# Patient Record
Sex: Male | Born: 1960 | Race: White | Hispanic: No | Marital: Married | Smoking: Never smoker
Health system: Southern US, Community
[De-identification: ages and names within clinical notes are randomized; demographics above are authoritative.]

## PROBLEM LIST (undated history)

## (undated) DIAGNOSIS — C170 Malignant neoplasm of duodenum: Secondary | ICD-10-CM

## (undated) DIAGNOSIS — Z9889 Other specified postprocedural states: Secondary | ICD-10-CM

## (undated) DIAGNOSIS — R112 Nausea with vomiting, unspecified: Secondary | ICD-10-CM

## (undated) DIAGNOSIS — T8859XA Other complications of anesthesia, initial encounter: Secondary | ICD-10-CM

## (undated) DIAGNOSIS — I480 Paroxysmal atrial fibrillation: Secondary | ICD-10-CM

## (undated) DIAGNOSIS — T4145XA Adverse effect of unspecified anesthetic, initial encounter: Secondary | ICD-10-CM

## (undated) DIAGNOSIS — Q057 Lumbar spina bifida without hydrocephalus: Secondary | ICD-10-CM

## (undated) HISTORY — DX: Lumbar spina bifida without hydrocephalus: Q05.7

## (undated) HISTORY — PX: SPINAL FIXATION SURGERY: SHX1055

## (undated) HISTORY — DX: Paroxysmal atrial fibrillation: I48.0

## (undated) HISTORY — PX: FOOT SURGERY: SHX648

## (undated) HISTORY — PX: VENTRAL HERNIA REPAIR: SHX424

## (undated) HISTORY — PX: PROSTATE BIOPSY: SHX241

## (undated) HISTORY — DX: Malignant neoplasm of duodenum: C17.0

## (undated) HISTORY — PX: EXPLORATORY LAPAROTOMY W/ BOWEL RESECTION: SHX1544

## (undated) NOTE — Telephone Encounter (Signed)
 Formatting of this note might be different from the original. done Electronically signed by Rosi Lauraine PEDLAR, MD at 07/05/2024  1:52 PM CDT

## (undated) NOTE — Telephone Encounter (Signed)
 Formatting of this note might be different from the original. Patient calls and LVM  Requesting a referral to [facility] for Prostate Cancer Order pended for provider review and completion   Electronically signed by Bedelia Milling, RN at 07/05/2024  1:35 PM CDT

---

## 2004-10-02 ENCOUNTER — Ambulatory Visit (HOSPITAL_BASED_OUTPATIENT_CLINIC_OR_DEPARTMENT_OTHER): Admission: RE | Admit: 2004-10-02 | Discharge: 2004-10-02 | Payer: Self-pay | Admitting: Orthopedic Surgery

## 2004-10-02 ENCOUNTER — Ambulatory Visit (HOSPITAL_COMMUNITY): Admission: RE | Admit: 2004-10-02 | Discharge: 2004-10-02 | Payer: Self-pay | Admitting: Orthopedic Surgery

## 2006-07-30 ENCOUNTER — Ambulatory Visit (HOSPITAL_BASED_OUTPATIENT_CLINIC_OR_DEPARTMENT_OTHER): Admission: RE | Admit: 2006-07-30 | Discharge: 2006-07-30 | Payer: Self-pay | Admitting: Orthopedic Surgery

## 2007-12-18 ENCOUNTER — Encounter: Admission: RE | Admit: 2007-12-18 | Discharge: 2007-12-18 | Payer: Self-pay | Admitting: Internal Medicine

## 2011-03-08 NOTE — Op Note (Signed)
Dennis Chase, Dennis Chase               ACCOUNT NO.:  [id number]   MEDICAL RECORD NO.:  [id number]          PATIENT TYPE:  AMB   LOCATION:  DSC                          FACILITY:  [facility]   PHYSICIAN:  Leonides Grills, M.D.     DATE OF BIRTH:  1961/06/18   DATE OF PROCEDURE:  07/30/2006  DATE OF DISCHARGE:                                 OPERATIVE REPORT   PREOPERATIVE DIAGNOSIS:  Left base of fifth metatarsal plantar spur.   POSTOPERATIVE DIAGNOSIS:  Left base of fifth metatarsal plantar spur.   OPERATION:  Partial excision, left base of fifth metatarsal.   SURGEON:  Leonides Grills, M.D.   ASSISTANT:  Evlyn Kanner, P.A.-C.   ANESTHESIA:  General.   ESTIMATED BLOOD LOSS:  Minimal.   TOURNIQUET TIME:  Approximately 20 minutes.   COMPLICATIONS:  None.   DISPOSITION:  Stable to PAR.   INDICATIONS:  This is a 50 year old male, who has had a triple arthrodesis  and multiple other surgical procedures, and has had a prominence at the base  of the fifth metatarsal with a possible impending ulcer.  He has  syringomyelia.  He was consented for the above procedure, and all risks,  which included infection, nerve and vessel injury, wound breakdown, possible  persistent ulcer and pain, possibility of pathologic fracture through this  area, were all explained.  Questions were encouraged and answered.   OPERATIVE PROCEDURE:  The patient was brought to the operating room and  placed in supine position.  After adequate general endotracheal tube  anesthesia was administered, as well as Ancef 1 g IV piggyback, a bump was  placed under the ipsilateral hip, internally rotating the left lower  extremity, and the left lower extremity was then prepped and draped in a  sterile manner.  Over a proximally placed thigh tourniquet, the limb was  gravity exsanguinated and the tourniquet was elevated to 290 mmHg.  A  longitudinal incision was then made at the base of the fifth metatarsal  laterally.   Dissection was carried down directly to bone.  Soft tissue was  elevated off the plantar aspect, lateral aspect of the base of the fifth  metatarsal, and with a 3/4-inch straight osteotome, the base of the left  fifth metatarsal was then removed.  An x-ray was obtained, lateral view, and  showed this was adequately decompressed and there was an even plane of bone  over the lateral column of the foot.  The area was copiously irrigated with  normal saline.  Bone wax was applied to exposed bone surfaces.  The wound  was copiously irrigated  one more time with normal saline.  The tourniquet was deflated.  Hemostasis  was obtained.  The subcu was closed with 3-0 Vicryl.  The skin was closed  with 4-0 nylon.  A sterile dressing was applied.  A hard-sole shoe was  applied.  The patient was stable to the PAR.      Leonides Grills, M.D.  Electronically Signed     PB/MEDQ  D:  07/30/2006  T:  07/30/2006  Job:  [id number]

## 2011-03-08 NOTE — Op Note (Signed)
NAMEANTHEM, FRAZER               ACCOUNT NO.:  [id number]   MEDICAL RECORD NO.:  [id number]          PATIENT TYPE:  AMB   LOCATION:  DSC                          FACILITY:  [facility]   PHYSICIAN:  Leonides Grills, M.D.     DATE OF BIRTH:  25-Mar-1961   DATE OF PROCEDURE:  10/02/2004  DATE OF DISCHARGE:                                 OPERATIVE REPORT   PREOPERATIVE DIAGNOSIS:  1.  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  2.  Left foot drop.  3.  Benign deep soft tissue lesion, foot.   POSTOPERATIVE DIAGNOSIS:  1.  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  2.  Left foot drop.  3.  Benign deep soft tissue lesion, foot.   OPERATION:  1.  Left lateralizing calcaneal osteotomy.  2.  Stress x-rays left foot.  3.  Posterior tibial tendon to dorsolateral mid foot tendon transfer.  4.  Excision benign deep soft tissue lesion foot.   ANESTHESIA:  General endotracheal anesthesia with popliteal block.   SURGEON:  Leonides Grills, M.D.   ASSISTANT:  Lianne Cure, P.A.-C.   ESTIMATED BLOOD LOSS:  Minimal.   TOURNIQUET TIME:  Approximately 2 hours.   COMPLICATIONS:  None.   DISPOSITION:  Stable to the PR.   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  He had a previous mid foot as  well as triple arthrodesis.  He has had pain that is interfering with his  life to the point he could not do what he wants to do.  He was consented for  the above procedure.  All risks which include infection, neurovascular  injury, posterior tibial tendon rupture, persistent pain, worsening pain,  possible future surgeries, were all explained, questions were encouraged and  answered.   OPERATION:  The patient was brought to the operating room and placed in  supine position.  After adequate general endotracheal anesthesia was  administered as well as Ancef 1 gram IV piggyback as well as popliteal  block, the left lower  extremity was then prepped and draped in a sterile  manner over a proximally placed thigh tourniquet.  The limb was gravity  exsanguinated and the tourniquet was elevated to 290 mmHg.  A longitudinal  incision over the posterior tibial tendon was then made.  Dissection was  carried down through the skin and hemostasis was obtained.  The posterior  tibial tendon was identified and was dissected out as distal as possible and  tenotomy was then performed.  Approximately 80 mm proximal to this on the  posterior medial border of the tibia, an incision was made, the saphenous  nerve and vein were identified and retracted out of harms way.  A fasciotomy  was then performed.  The EHL was identified and retracted out of harms way.  The posterior tibial tendon was then identified and by pulling distally, the  tendon was verified.  This was then pulled out of the wound proximally and  placed in a moist lap.  We then made a  longitudinal incision between the  lateral anterior tibial crest as well as the medial aspect of the anterior  tibialis tendon.  Dissection was carried down through skin staying onto  bone.  With a Kelly clamp, the interosseous membrane between the tibia and  fibula was then split and with a tendon suture passer after #2 FiberWire was  placed in a Krakow stitch in the distal aspect of the posterior tibial  tendon, this was then pulled from the posterior aspect of the tibia around  the lateral aspect of the tibia through this anterior wound.  We then  tunneled subcutaneously anteriorly to the ankle to a point on the mid foot  area.  Under C-arm guidance, this area was then verified, as well.  Using  the C-arm, we dorsiflexed the ankle and we found that there was already  anterior ankle impingement and there was no need to do a percutaneous tendo-  Achilles lengthening for the amount of dorsiflexion that would be obtained  was minimal.  We  elected not to perform a dorsiflexion first  metatarsal  osteotomy due to the fact that the forefoot was relatively balanced and due  to the hindfoot malalignment, we felt that just with a lateralizing  calcaneal osteotomy, this would be helpful.  We then made a drill hole under  C-arm guidance between the middle and lateral cuneiforms just medial to one  of the staples in the bone.  This was drilled with a 7 mm drill.  A 7 by 23  mm [employer] tenodesis screw was then chosen and using the standard  technique, posterior tibial tendon was placed in this hole and this had an  excellent repair.  We then sewed the remaining posterior tibial tendon to  the remaining periosteum locally, as well.  This was done with the ankle in  maximal dorsiflexion.  There was an excellent repair and we were able to  range the ankle and the tendon was stable.  We then made a longitudinal  incision over the lateral aspect of the calcaneal tuber, dissection was  carried down to bone, soft tissues were elevated on both superior and  inferior aspects of the bone and with a sagittal saw, this was then cut.  The soft tissue was stretched with a laminar spreader and an osteotome and  the heel was translated approximately 5-6 mm laterally.  This was  provisionally fixed with a K-wire.  A longitudinal incision was then made on  the claval skin border.  Dissection was carried down to bone.  With a 4.5,  3.2 mm drill holes respectively were made and a 60 mm long 16 mm threaded  6.5 mm partially threaded cancellous screw was placed, this had excellent  compression across the osteotomy site.  Stress x-rays were obtained in the  lateral and axial views and showed that the hardware was in proper position,  there was no gross motion at the osteotomy site.  A longitudinal incision  was then made over the adventitial soft tissue lesion at the base of the  fifth metatarsal.  Dissection was carried down through the skin, this was completely dissected out in total, as well.   This was benign adventitial  type tissue from repeated wear over this area and there was no need to send  this to pathology.  The tourniquet was deflated, hemostasis was obtained.  The deep tissues were closed with 3-0 Vicryl, the skin was closed with 4-0  nylon over all wounds.  A sterile  dressing was applied, a modified Jones  dressing was applied with the ankle in neutral dorsiflexion.  The patient  was stable to the PR.      Paul   PB/MEDQ  D:  10/02/2004  T:  10/02/2004  Job:  [id number]

## 2011-06-06 ENCOUNTER — Other Ambulatory Visit: Payer: Self-pay | Admitting: Gastroenterology

## 2011-06-06 ENCOUNTER — Ambulatory Visit (HOSPITAL_COMMUNITY)
Admission: RE | Admit: 2011-06-06 | Discharge: 2011-06-06 | Disposition: A | Discharge status: Home / Self Care | Payer: BC Managed Care – PPO | Source: Ambulatory Visit | Attending: Gastroenterology | Admitting: Gastroenterology

## 2011-06-06 DIAGNOSIS — K449 Diaphragmatic hernia without obstruction or gangrene: Secondary | ICD-10-CM | POA: Insufficient documentation

## 2011-06-06 DIAGNOSIS — K219 Gastro-esophageal reflux disease without esophagitis: Secondary | ICD-10-CM | POA: Insufficient documentation

## 2011-06-06 DIAGNOSIS — D1330 Benign neoplasm of unspecified part of small intestine: Secondary | ICD-10-CM | POA: Insufficient documentation

## 2011-06-06 DIAGNOSIS — Z860100 Personal history of colon polyps, unspecified: Secondary | ICD-10-CM | POA: Insufficient documentation

## 2011-06-13 NOTE — Op Note (Signed)
  NAMETRAVARIS, Dennis NO.:  [id number]  MEDICAL RECORD NO.:  [id number]  LOCATION:  WLEN                         FACILITY:  [facility]  PHYSICIAN:  Danise Edge, M.D.   DATE OF BIRTH:  [birth year]  DATE OF PROCEDURE:  06/06/2011 DATE OF DISCHARGE:                              OPERATIVE REPORT   REFERRING PHYSICIAN:  Leone Haven, MD, Department of Surgery, [facility].  HISTORY:  Mr. Mariusz Jubb is a 50 year old male born on 1961-03-20. In 2009, the patient was diagnosed with a tubulovillous adenoma involving the duodenum.  The patient was referred to Dr. Leone Haven at [facility], who performed a modified-Whipple procedure to remove the large tubulovillous adenoma of the duodenum. The patient was scheduled to undergo a diagnostic esophagogastroduodenoscopy to ensure no recurrence of the neoplastic tissue in the small intestine.  ENDOSCOPIST:  Danise Edge, MD  PREMEDICATION: 1. Fentanyl 75 mcg. 2. Versed 7.5 mg.  PROCEDURE IN DETAIL:  The patient was placed in the left lateral decubitus position.  The Pentax gastroscope was passed through the posterior hypopharynx into the proximal esophagus without difficulty. The hypopharynx, larynx, and vocal cords appeared normal.  Gastroscopy:  There is a small hiatal hernia.  Retroflexed view of the gastric cardia and fundus was normal.  The gastric body, antrum and pylorus appear normal.  Enteroscopy:  The proximal small intestine appears normal.  The surgical anastomosis was identified and appeared normal.  Biopsies were taken from the surgical anastomosis.  Both the efferent and afferent limbs of the small intestine appeared normal.  I do not see any obvious residual neoplastic tissue in the small intestine.  ASSESSMENT:  Normal esophagogastroduodenoscopy post modified-Whipple procedure to remove a tubulovillous adenoma of the duodenum.  Biopsies were performed  at the surgical anastomosis.  No obvious neoplastic tissue was identified.          ______________________________ Danise Edge, M.D.     MJ/MEDQ  D:  06/06/2011  T:  06/06/2011  Job:  [id number]  cc:   Leone Haven, MD Fax: [phone]  Electronically Signed by Danise Edge M.D. on 06/13/2011 04:48:09 PM

## 2015-01-23 ENCOUNTER — Other Ambulatory Visit: Payer: Self-pay | Admitting: Internal Medicine

## 2015-01-23 DIAGNOSIS — N509 Disorder of male genital organs, unspecified: Secondary | ICD-10-CM

## 2015-01-25 ENCOUNTER — Ambulatory Visit
Admission: RE | Admit: 2015-01-25 | Discharge: 2015-01-25 | Disposition: A | Discharge status: Home / Self Care | Payer: BC Managed Care – PPO | Source: Ambulatory Visit | Attending: Internal Medicine | Admitting: Internal Medicine

## 2015-01-25 DIAGNOSIS — N509 Disorder of male genital organs, unspecified: Secondary | ICD-10-CM

## 2015-02-10 ENCOUNTER — Other Ambulatory Visit: Payer: Self-pay | Admitting: Internal Medicine

## 2015-02-10 ENCOUNTER — Ambulatory Visit
Admission: RE | Admit: 2015-02-10 | Discharge: 2015-02-10 | Disposition: A | Discharge status: Home / Self Care | Payer: BC Managed Care – PPO | Source: Ambulatory Visit | Attending: Internal Medicine | Admitting: Internal Medicine

## 2015-02-10 DIAGNOSIS — R0602 Shortness of breath: Secondary | ICD-10-CM

## 2015-02-13 ENCOUNTER — Other Ambulatory Visit: Payer: Self-pay | Admitting: Cardiology

## 2015-02-13 ENCOUNTER — Encounter: Payer: Self-pay | Admitting: Cardiology

## 2015-02-13 DIAGNOSIS — R0600 Dyspnea, unspecified: Secondary | ICD-10-CM

## 2015-02-13 NOTE — Progress Notes (Signed)
Patient ID: Dennis Chase, male   DOB: 1961-03-18, 55 y.o.   MRN: [id number]    Larnce, Schnackenberg    Date of visit:  02/13/2015 DOB:  18-Jan-1961    Age:  12 yrs. Medical record number:  [id number]     Account number:  [id number] Primary Care Provider: SHAMLEFFER,IBTEHAL ____________________________ CURRENT DIAGNOSES  1. Chest pain  2. Dyspnea  3. Palpitations  ____________________________ ALLERGIES  No Known Allergies ____________________________ MEDICATIONS  1. zolpidem 10 mg tablet, 1/2 hs prn  2. ibuprofen 200 mg tablet, PRN ____________________________ CHIEF COMPLAINTS  Anxious feeling chest  Dyspnea  Palp ____________________________ HISTORY OF PRESENT ILLNESS This very nice 54 year old male is seen urgently for evaluation of 3 episodes of some exertional type symptoms. The patient has had a prior history of spinal bifida with repair as an infant and then several corrective surgeries on his lower leg previously. 2 years ago he had resection of a tumor of his duodenum at [facility] and reports having had an echo at that time. He had any recent urinary tract infection and was seen by urology and treated with Cipro about 3 weeks ago. Within the past 2 weeks he has experienced one episode where he was carrying his saxophone a strap around his neck and had the onset of upper neck tightness as well as some shortness of breath. He normally is active and rides his bicycle to work everyday. He had another episode where he was turning his head to one side and developed fairly sudden onset of sharp pain in his neck and shoulders associated with some shortness of breath and then resolved. He was getting on his bicycle to come home on Friday and developed discomfort across his upper shoulders and neck associated with some shortness of breath. He had difficulty completing his bike ride home and complained of some irregularity in his pulse. He was seen at his primary doctor's office that afternoon and was out was  asked to see him urgently today. He has felt well over the weekend. At no time did he have any anterior pressure-type chest pain. He has never had syncope. There's no premature family history of cardiovascular disease and his lipid status is previously been completely normal. He has no prior history of hypertension. His primary doctor was quite worried about possible crescendo cardiac symptoms.  The patient had resection of a duodenal adenoma at [facility] several years ago. 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 In 2013 he had a brief episode of atrial fibrillation occurring after surgery that responded to metoprolol. An echocardiogram done at [facility] at that time showed a possible bicuspid aortic valve with fusion of the raphae but normal left ventricular function. ____________________________ PAST HISTORY  Past Medical Illnesses:  spinal bifida;  Cardiovascular Illnesses:  no previous history of cardiac disease.;  Surgical Procedures:  spinal bifida surg, feet surg, tumor duodenum, ventral hernia surgery x 3;  NYHA Classification:  I;  Canadian Angina Classification:  Class 0: Asymptomatic;  Cardiology Procedures-Invasive:  no history of prior cardiac procedures;  Cardiology Procedures-Noninvasive:  echocardiogram;  LVEF not documented,   ____________________________ CARDIO-PULMONARY TEST DATES EKG Date:  02/13/2015;   ____________________________ FAMILY HISTORY Brother -- Brother alive with problem, Atrial fibrillation Brother -- Brother alive with problem, Atrial fibrillation Brother -- Brother alive and well Father -- Father alive with problem, CVA, Pacemaker in situ Mother -- Mother dead, Pulmonary emphysema ____________________________ SOCIAL HISTORY Alcohol  Use:  occasionally;  Smoking:  never smoked;  Diet:  regular diet;  Lifestyle:  divorced and remarried;  Exercise:  cycling 5 days per week and  weight lifting;  Occupation:  [employer]  Residence:  lives with wife;   ____________________________ REVIEW OF SYSTEMS General:  denies recent weight change, fatique or change in exercise tolerance.  Integumentary :no rashes or new skin lesions. Eyes: denies diplopia, history of glaucoma or visual problems. Respiratory: intermittent dyspnea Cardiovascular:  please review HPI Abdominal: denies dyspepsia, GI bleeding, constipation, or diarrhea Genitourinary-Male: recent UTI and orchitis  Musculoskeletal:  denies arthritis, venous insufficiency, or muscle weakness Neurological:  denies headaches, stroke, or TIA Psychiatric:  denies depression or anxiety  ____________________________ PHYSICAL EXAMINATION VITAL SIGNS  Blood Pressure:  110/70 Sitting, Right arm, regular cuff  , 110/74 Standing, Right arm and regular cuff   Pulse:  60/min. Weight:  182.00 lbs. Height:  71"BMI: 25  Constitutional:  pleasant white male in no acute distress Skin:  warm and dry to touch, no apparent skin lesions, or masses noted. Head:  normocephalic, normal hair pattern, no masses or tenderness Eyes:  EOMS Intact, PERRLA, C and S clear, Funduscopic exam not done. ENT:  ears, nose and throat reveal no gross abnormalities.  Dentition good. Neck:  supple, without massess. No JVD, thyromegaly or carotid bruits. Carotid upstroke normal. Chest:  normal symmetry, clear to auscultation. Cardiac:  regular rhythm, normal S1 and S2, No S3 or S4, no murmurs, gallops or rubs detected. Abdomen:  well healed midline surgical scar, abdomen soft, no masses, no hepatosplenomegaly Peripheral Pulses:  the femoral,dorsalis pedis, and posterior tibial pulses are full and equal bilaterally with no bruits auscultated. Extremities & Back:  feet deformed with some surgical scars, some atrophy lower and upper legs Neurological:  abnormal gait due to prior spina bifida ____________________________ IMPRESSIONS/PLAN  1. Somewhat  atypical onset of neck and shoulder pain with abrupt onset associated with shortness of breath and vague irregularity of the pulse. His examination and EKG are normal. He is not able to do treadmill exercise due to gait abnormalities due to his spinal bifida. 2. Bicuspid aortic valve on ECHO at [facility] 2013 3. Brief post op a fib 2013  His 10 year cardiovascular risk profile is an estimated 2.2% risk of cardiac events which is very low risk over the next 10 years as well as an absolute lifetime risk of only 5.2%.  It is possible this could be some sort of cervical disc disease although this all occurred in the setting of recent treatment of a urinary tract infection. He is not currently having palpitations.  My recommendations would be for him to have a bicycle-type exercise test and we will arrange through the CPX lab at [facility]. If he continues to have palpitations then I would suggest he wear a cardiac event monitor. Try to obtain the echocardiogram that was done through [facility] and we may oder echo if he continues to have symptoms. I will await the results of the cardiopulmonary exercise test. Thank you for asking me to see him with you. ____________________________ TODAYS ORDERS  1. cardiopulmonary stress test  2. 12 Lead EKG: Today                       ____________________________ Cardiology Physician:  Kerry Hough MD [facility]

## 2015-02-22 ENCOUNTER — Ambulatory Visit (HOSPITAL_COMMUNITY): Payer: BC Managed Care – PPO

## 2015-02-22 ENCOUNTER — Encounter: Payer: Self-pay | Admitting: Cardiology

## 2015-02-22 ENCOUNTER — Inpatient Hospital Stay (HOSPITAL_COMMUNITY)
Admission: AD | Admit: 2015-02-22 | Discharge: 2015-02-23 | DRG: 310 | Disposition: A | Discharge status: Home / Self Care | Payer: BC Managed Care – PPO | Source: Ambulatory Visit | Attending: Cardiology | Admitting: Cardiology

## 2015-02-22 ENCOUNTER — Encounter (HOSPITAL_COMMUNITY): Payer: Self-pay | Admitting: General Practice

## 2015-02-22 ENCOUNTER — Other Ambulatory Visit: Payer: Self-pay | Admitting: Cardiology

## 2015-02-22 ENCOUNTER — Inpatient Hospital Stay (HOSPITAL_BASED_OUTPATIENT_CLINIC_OR_DEPARTMENT_OTHER): Payer: BC Managed Care – PPO

## 2015-02-22 ENCOUNTER — Inpatient Hospital Stay (HOSPITAL_COMMUNITY): Payer: BC Managed Care – PPO

## 2015-02-22 DIAGNOSIS — I4891 Unspecified atrial fibrillation: Secondary | ICD-10-CM | POA: Insufficient documentation

## 2015-02-22 DIAGNOSIS — I4819 Other persistent atrial fibrillation: Secondary | ICD-10-CM | POA: Diagnosis present

## 2015-02-22 DIAGNOSIS — R0602 Shortness of breath: Secondary | ICD-10-CM | POA: Diagnosis present

## 2015-02-22 DIAGNOSIS — I712 Thoracic aortic aneurysm, without rupture: Secondary | ICD-10-CM

## 2015-02-22 DIAGNOSIS — I7121 Aneurysm of the ascending aorta, without rupture: Secondary | ICD-10-CM

## 2015-02-22 DIAGNOSIS — Z85068 Personal history of other malignant neoplasm of small intestine: Secondary | ICD-10-CM

## 2015-02-22 DIAGNOSIS — I77819 Aortic ectasia, unspecified site: Secondary | ICD-10-CM | POA: Diagnosis present

## 2015-02-22 DIAGNOSIS — R0600 Dyspnea, unspecified: Secondary | ICD-10-CM | POA: Diagnosis not present

## 2015-02-22 DIAGNOSIS — I480 Paroxysmal atrial fibrillation: Secondary | ICD-10-CM | POA: Diagnosis present

## 2015-02-22 DIAGNOSIS — Z87440 Personal history of urinary (tract) infections: Secondary | ICD-10-CM | POA: Diagnosis not present

## 2015-02-22 HISTORY — DX: Other specified postprocedural states: Z98.890

## 2015-02-22 HISTORY — DX: Adverse effect of unspecified anesthetic, initial encounter: T41.45XA

## 2015-02-22 HISTORY — DX: Other complications of anesthesia, initial encounter: T88.59XA

## 2015-02-22 HISTORY — DX: Nausea with vomiting, unspecified: R11.2

## 2015-02-22 LAB — COMPREHENSIVE METABOLIC PANEL
ALBUMIN: 4 g/dL (ref 3.5–5.0)
ALT: 38 U/L (ref 17–63)
AST: 35 U/L (ref 15–41)
Alkaline Phosphatase: 52 U/L (ref 38–126)
Anion gap: 11 (ref 5–15)
BUN: 14 mg/dL (ref 6–20)
CHLORIDE: 105 mmol/L (ref 101–111)
CO2: 23 mmol/L (ref 22–32)
CREATININE: 0.98 mg/dL (ref 0.61–1.24)
Calcium: 9.3 mg/dL (ref 8.9–10.3)
GFR calc Af Amer: >60 mL/min (ref >60)
GFR calc non Af Amer: >60 mL/min (ref >60)
Glucose, Bld: 84 mg/dL (ref 70–99)
Potassium: 4.4 mmol/L (ref 3.5–5.1)
Sodium: 139 mmol/L (ref 135–145)
TOTAL PROTEIN: 6.5 g/dL (ref 6.5–8.1)
Total Bilirubin: 1 mg/dL (ref 0.3–1.2)

## 2015-02-22 LAB — CBC WITH DIFFERENTIAL/PLATELET
BASOS ABS: 0.1 10*3/uL (ref 0.0–0.1)
Basophils Relative: 1 % (ref 0–1)
EOS PCT: 2 % (ref 0–5)
Eosinophils Absolute: 0.1 10*3/uL (ref 0.0–0.7)
HCT: 43.1 % (ref 39.0–52.0)
Hemoglobin: 15.1 g/dL (ref 13.0–17.0)
LYMPHS ABS: 1.4 10*3/uL (ref 0.7–4.0)
Lymphocytes Relative: 26 % (ref 12–46)
MCH: 30.4 pg (ref 26.0–34.0)
MCHC: 35 g/dL (ref 30.0–36.0)
MCV: 86.7 fL (ref 78.0–100.0)
Monocytes Absolute: 0.6 10*3/uL (ref 0.1–1.0)
Monocytes Relative: 10 % (ref 3–12)
NEUTROS PCT: 61 % (ref 43–77)
Neutro Abs: 3.5 10*3/uL (ref 1.7–7.7)
PLATELETS: 192 10*3/uL (ref 150–400)
RBC: 4.97 MIL/uL (ref 4.22–5.81)
RDW: 13.2 % (ref 11.5–15.5)
WBC: 5.6 10*3/uL (ref 4.0–10.5)

## 2015-02-22 LAB — TSH: TSH: 2.197 u[IU]/mL (ref 0.350–4.500)

## 2015-02-22 LAB — HEPARIN LEVEL (UNFRACTIONATED): Heparin Unfractionated: 0.21 IU/mL — ABNORMAL LOW (ref 0.30–0.70)

## 2015-02-22 LAB — MAGNESIUM: Magnesium: 1.9 mg/dL (ref 1.7–2.4)

## 2015-02-22 MED ORDER — HEPARIN (PORCINE) IN NACL 100-0.45 UNIT/ML-% IJ SOLN
1250.0000 [IU]/h | INTRAMUSCULAR | Status: DC
  Administered 2015-02-22: 1050 [IU]/h via INTRAVENOUS
  Administered 2015-02-23: 1250 [IU]/h via INTRAVENOUS
  Filled 2015-02-22 (×4): qty 250

## 2015-02-22 MED ORDER — HEPARIN BOLUS VIA INFUSION
1000.0000 [IU] | Freq: Once | INTRAVENOUS | Status: AC
  Administered 2015-02-22: 1000 [IU] via INTRAVENOUS
  Filled 2015-02-22: qty 1000

## 2015-02-22 MED ORDER — SODIUM CHLORIDE 0.9 % IV SOLN: 250.0000 mL | INTRAVENOUS | Status: DC | PRN

## 2015-02-22 MED ORDER — ONDANSETRON HCL 4 MG/2ML IJ SOLN: 4.0000 mg | Freq: Four times a day (QID) | INTRAMUSCULAR | Status: DC | PRN

## 2015-02-22 MED ORDER — FLECAINIDE ACETATE 50 MG PO TABS
50.0000 mg | ORAL_TABLET | Freq: Two times a day (BID) | ORAL | Status: DC
  Administered 2015-02-22 – 2015-02-23 (×3): 50 mg via ORAL
  Filled 2015-02-22 (×4): qty 1

## 2015-02-22 MED ORDER — HEPARIN (PORCINE) IN NACL 100-0.45 UNIT/ML-% IJ SOLN: 1050.0000 [IU]/h | INTRAMUSCULAR | Status: DC

## 2015-02-22 MED ORDER — ACETAMINOPHEN 325 MG PO TABS: 650.0000 mg | ORAL_TABLET | ORAL | Status: DC | PRN

## 2015-02-22 MED ORDER — HEPARIN BOLUS VIA INFUSION
4000.0000 [IU] | Freq: Once | INTRAVENOUS | Status: AC
  Administered 2015-02-22: 4000 [IU] via INTRAVENOUS
  Filled 2015-02-22: qty 4000

## 2015-02-22 MED ORDER — SODIUM CHLORIDE 0.9 % IJ SOLN
3.0000 mL | Freq: Two times a day (BID) | INTRAMUSCULAR | Status: DC
  Administered 2015-02-22 – 2015-02-23 (×2): 3 mL via INTRAVENOUS

## 2015-02-22 MED ORDER — SODIUM CHLORIDE 0.9 % IV SOLN
INTRAVENOUS | Status: DC
  Administered 2015-02-22: 22:00:00 via INTRAVENOUS

## 2015-02-22 MED ORDER — SODIUM CHLORIDE 0.9 % IJ SOLN: 3.0000 mL | INTRAMUSCULAR | Status: DC | PRN

## 2015-02-22 MED ORDER — METOPROLOL TARTRATE 25 MG PO TABS
25.0000 mg | ORAL_TABLET | Freq: Two times a day (BID) | ORAL | Status: DC
  Administered 2015-02-22 – 2015-02-23 (×3): 25 mg via ORAL
  Filled 2015-02-22 (×4): qty 1

## 2015-02-22 NOTE — H&P (Signed)
History and Physical   Admit date: 02/21/2015 Name:  Dennis Chase Medical record number: [id number] DOB/Age:  [birth year]  54 y.o. male  Primary Cardiologist:  Wynonia Lawman  Primary Physician:  Deforest Hoyles  Chief complaint/reason for admission: dyspnea and atrial fibrillation  HPI:  This very nice 54 year old male is admitted to the hospital for atrial fibrillation. The patient was initially seen by me on April 25 with 3 episodes of exertional type symptoms. He has a prior history of repair of spinal bifida as an infant and has has had several corrective surgeries on his lower legs and feet in the past. He had resection of a duodenal adenoma at [facility] several years ago. At that time there was a small focus of adenocarcinoma and he did well following the episodes may have had ventral hernia surgery. In 2013 he had a brief episode of atrial fibrillation occurring after surgery that responded to metoprolol. An echocardiogram done at [facility] at that time showed a possible bicuspid aortic valve with fusion of the raphae but normal LV function. He had a recent urinary tract infection was seen by urology and treated with Cipro about 3 weeks ago. 2 weeks prior to being seen he experienced one episode where he was carrying his saxophone on a strap around his neck and had the onset of upper neck tightness as well as shortness of breath. He normally is active and rides his bicycle to work everyday. He had another episode where he was turning his head to the side and developed sudden onset of sharp pain in his neck and shoulders associated with shortness of breath which then resolved. He was riding his bicycle home from work prior to being seen and developed discomfort across the upper shoulders and neck associated with shortness of breath and had difficulty completing his bike ride home and noted some mild irregularity in his pulse. He was seen at his primary care doctor's office that afternoon and was in sinus  rhythm and I was asked to see him urgently the next week. He felt well and at the time I was seeing him he was in sinus rhythm and felt fine. Because of his previous foot abnormality with spinal bifida he was not felt to be a good candidate for treadmill testing and arrangements were made for a CPX bicycle  test. A couple of days after having the visit with me he developed worsening shortness of breath with riding his bicycle that has persisted even at rest. He presented this morning for a CPX test and was found to be in atrial fibrillation. He is admitted to the hospital at this time for echocardiogram and TEE guided cardioversion since he remains symptomatic. His CHA2DS2VASC score is zero.   Past Medical History  Diagnosis Date  . Spina bifida of lumbar region   . Paroxysmal atrial fibrillation   . Adenocarcinoma of duodenum      Past Surgical History  Procedure Laterality Date  . Spinal fixation surgery    . Ventral hernia repair    . Foot surgery      multiple for spinal bifida  . Exploratory laparotomy w/ bowel resection      resection of duodenum   Allergies: has No Known Allergies.   Medications: Prior to Admission medications   Not on File    Family History:  Family Status  Relation Status Death Age  . Father Alive     CVA, pacer  . Mother Deceased     emphysema  .  Brother Alive     atrial fibrillation  . Brother Alive     atrial fibrillation  . Brother Alive     Social History:   reports that he has never smoked. He has never used smokeless tobacco. He reports that he drinks alcohol. He reports that he does not use illicit drugs.   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 PERRL, EOM's intact. Fundi not examined  Neck: no adenopathy, no carotid bruit, no JVD and supple, symmetrical, trachea midline Lungs: clear to auscultation bilaterally Heart: irregularly irregular rhythm, S1, S2 normal and no S3 or S4 Abdomen: soft, non-tender; bowel sounds normal; no masses,  no organomegaly Rectal: deferred Extremities: prior deformity and surgical scar of both feet Pulses: 2+ and symmetric Skin: Skin color, texture, turgor normal. No rashes or lesions Neurologic: abnormal gait, somewhat spastic, otherwise neuro normal  Labs: Pending  EKG: Atrial fibrillation with RVR  Radiology: CXR normal   IMPRESSIONS: 1. Paroxysmal atrial fibrillation persistent now 2. Possible bicuspid aortic valve  PLAN:  He will be placed on heparin and we will arrange for him to have an echocardiogram. He will be started on beta blockers as well as flecainide. Plan TEE cardioversion when it can be arranged.  Signed: Kerry Hough MD [facility]  02/22/2015, 10:52 AM

## 2015-02-22 NOTE — Progress Notes (Signed)
  Echocardiogram 2D Echocardiogram has been performed.  Diamond Nickel 02/22/2015, 3:26 PM

## 2015-02-22 NOTE — Progress Notes (Addendum)
ANTICOAGULATION CONSULT NOTE - Initial Consult  Pharmacy Consult for Heparin  Indication: atrial fibrillation  No Known Allergies  Patient Measurements: Height: 5\' 11"  (180.3 cm) Weight: 176 lb 11.2 oz (80.151 kg) (scaleC) IBW/kg (Calculated) : 75.3 Heparin Dosing Weight: 80 kg   Vital Signs: Temp: 97.7 F (36.5 C) (05/04 1223) Temp Source: Oral (05/04 1223) BP: 104/60 mmHg (05/04 1223) Pulse Rate: 43 (05/04 1223)  Labs: No results for input(s): HGB, HCT, PLT, APTT, LABPROT, INR, HEPARINUNFRC, CREATININE, CKTOTAL, CKMB, TROPONINI in the last 72 hours.  CrCl cannot be calculated (Patient has no serum creatinine result on file.).   Medical History: Past Medical History  Diagnosis Date  . Spina bifida of lumbar region   . Paroxysmal atrial fibrillation   . Adenocarcinoma of duodenum     Medications:  No prescriptions prior to admission    Assessment: 31 YOM who was admitted to the hospital for atrial fibrillation. Cardiology is planning to perform and echocardiogram and TEE guided cardioversion since he remains symptomatic. His CHA2DS2VASC score is zero. H/H and Plt wnl.   Goal of Therapy:  Heparin level 0.3-0.7 units/ml Monitor platelets by anticoagulation protocol: Yes   Plan:  -Start with heparin bolus of 4000 units followed by heparin infusion at 1050 units/hr -F/u 2200 heparin level -Monitor daily HL, CBC and s/s of bleeding  Albertina Parr, PharmD., BCPS Clinical Pharmacist Pager [phone]  Addum:  Initial HL is low at  0.21 units/ml Will give small bolus 1000 units and increase drip to 1250 units/hr Check am labs Excell Seltzer, PharmD

## 2015-02-23 ENCOUNTER — Other Ambulatory Visit: Payer: Self-pay

## 2015-02-23 ENCOUNTER — Ambulatory Visit (HOSPITAL_COMMUNITY)
Admission: RE | Admit: 2015-02-23 | Payer: BC Managed Care – PPO | Source: Ambulatory Visit | Admitting: Internal Medicine

## 2015-02-23 ENCOUNTER — Inpatient Hospital Stay (HOSPITAL_COMMUNITY): Payer: BC Managed Care – PPO

## 2015-02-23 ENCOUNTER — Encounter (HOSPITAL_COMMUNITY)
Admission: AD | Disposition: A | Discharge status: Home / Self Care | Payer: Self-pay | Source: Ambulatory Visit | Attending: Cardiology

## 2015-02-23 DIAGNOSIS — R0600 Dyspnea, unspecified: Secondary | ICD-10-CM

## 2015-02-23 LAB — HEPARIN LEVEL (UNFRACTIONATED): Heparin Unfractionated: 0.4 IU/mL (ref 0.30–0.70)

## 2015-02-23 SURGERY — ECHOCARDIOGRAM, TRANSESOPHAGEAL
Anesthesia: Monitor Anesthesia Care

## 2015-02-23 MED ORDER — FLECAINIDE ACETATE 50 MG PO TABS: 50.0000 mg | ORAL_TABLET | Freq: Two times a day (BID) | ORAL | Status: DC

## 2015-02-23 MED ORDER — ASPIRIN 81 MG PO CHEW
81.0000 mg | CHEWABLE_TABLET | Freq: Every day | ORAL | Status: DC
  Administered 2015-02-23: 81 mg via ORAL
  Filled 2015-02-23: qty 1

## 2015-02-23 MED ORDER — METOPROLOL TARTRATE 25 MG PO TABS: 25.0000 mg | ORAL_TABLET | Freq: Two times a day (BID) | ORAL | Status: DC

## 2015-02-23 NOTE — Discharge Summary (Signed)
Physician Discharge Summary  Patient ID: Merrit Friesen MRN: [id number] DOB/AGE: 1961-03-29 54 y.o.  Admit date: 02/22/2015 Discharge date: 02/23/2015  Primary Physician:  Dr. Deforest Hoyles  Primary Discharge Diagnosis:  1.  Paroxysmal atrial fibrillation  Secondary Discharge Diagnosis: 2.  History of spinal bifida 3.  Mildly dilated thoracic ascending aorta  Procedures:  2-D echocardiogram  Hospital Course: This very nice 54 year old male is admitted to the hospital for atrial fibrillation. The patient was initially seen by me on April 25 with 3 episodes of exertional type symptoms. He has a prior history of repair of spinal bifida as an infant and has has had several corrective surgeries on his lower legs and feet in the past. He had resection of a duodenal adenoma at [facility] several years ago. At that time there was a small focus of adenocarcinoma and he did well following the episodes may have had ventral hernia surgery. In 2013 he had a brief episode of atrial fibrillation occurring after surgery that responded to metoprolol. An echocardiogram done at [facility] at that time showed a possible bicuspid aortic valve with fusion of the raphae but normal LV function. He had a recent urinary tract infection was seen by urology and treated with Cipro about 3 weeks ago. 2 weeks prior to being seen he experienced one episode where he was carrying his saxophone on a strap around his neck and had the onset of upper neck tightness as well as shortness of breath. He normally is active and rides his bicycle to work everyday. He had another episode where he was turning his head to the side and developed sudden onset of sharp pain in his neck and shoulders associated with shortness of breath which then resolved. He was riding his bicycle home from work prior to being seen and developed discomfort across the upper shoulders and neck associated with shortness of breath and had difficulty completing his bike  ride home and noted some mild irregularity in his pulse. He was seen at his primary care doctor's office that afternoon and was in sinus rhythm and I was asked to see him urgently the next week. He felt well and at the time I was seeing him he was in sinus rhythm and felt fine. Because of his previous foot abnormality with spinal bifida he was not felt to be a good candidate for treadmill testing and arrangements were made for a CPX bicycle test. A couple of days after having the visit with me he developed worsening shortness of breath with riding his bicycle that has persisted even at rest. He presented the morning of admission for a CPX test and was found to be in atrial fibrillation. He is admitted to the hospital for further treatment of atrial fibrillation.  The patient was initiated on heparin and started on beta blockers and flecainide 50 mg twice a day.  An echocardiogram showed very mild LVH, EF of 50-55%, upper limits of normal RV and LA, and a mildly dilated thoracic aorta.  Lab was normal as noted below.  He converted to sinus rhythm at about 1 AM after he was admitted.  He felt better the next day.  He was ambulatory in the hall and prior to discharge arranged a noncontrast CT that showed proximal width of 3.9 cm.  He will be discharged on flecainide and metoprolol and will wear an event monitor on discharge.  He should resume normal activities.  It is anticipated if he has early recurrence of atrial fibrillation that  he may be referred for early ablation.  He has a very strong family history of atrial fibrillation and there is a distinct possibility that this is familial.    Discharge Exam: Blood pressure 92/60, pulse 63, temperature 97.7 F (36.5 C), temperature source Oral, resp. rate 18, height 5\' 11"  (1.803 m), weight 81.239 kg (179 lb 1.6 oz), SpO2 97 %. Weight: 81.239 kg (179 lb 1.6 oz) (c scale) Lungs clear, regular rhythm no S3  Labs: CBC:   Lab Results  Component Value Date    WBC 5.6 02/22/2015   HGB 15.1 02/22/2015   HCT 43.1 02/22/2015   MCV 86.7 02/22/2015   PLT 192 02/22/2015    CMP:  Recent Labs Lab 02/22/15 1430  NA 139  K 4.4  CL 105  CO2 23  BUN 14  CREATININE 0.98  CALCIUM 9.3  PROT 6.5  BILITOT 1.0  ALKPHOS 52  ALT 38  AST 35  GLUCOSE 84   Thyroid: Lab Results  Component Value Date   TSH 2.197 02/22/2015   Radiology: Normal chest x-ray  EKG: Atrial fibrillation on admission otherwise normal.  Currently normal sinus rhythm.  Discharge Medications:   Medication List    STOP taking these medications        naproxen sodium 220 MG tablet  Commonly known as:  ANAPROX      TAKE these medications        cetirizine 10 MG tablet  Commonly known as:  ZYRTEC  Take 10 mg by mouth daily.     Cholecalciferol 1000 UNITS tablet  Take 185 mg by mouth daily.     flecainide 50 MG tablet  Commonly known as:  TAMBOCOR  Take 1 tablet (50 mg total) by mouth every 12 (twelve) hours.     ibuprofen 200 MG tablet  Commonly known as:  ADVIL,MOTRIN  Take 200 mg by mouth at bedtime as needed. For pain     metoprolol tartrate 25 MG tablet  Commonly known as:  LOPRESSOR  Take 1 tablet (25 mg total) by mouth 2 (two) times daily.     zolpidem 10 MG tablet  Commonly known as:  AMBIEN  Take 5 mg by mouth at bedtime as needed for sleep.         Followup plans and appointments: See Dr. Wynonia Lawman in one week.  Go by the office to pick up an event monitor to begin wearing every day.  Time spent with patient to include physician time:  40 minutes  Signed: W. Doristine Church. MD [facility] 02/23/2015, 9:49 AM

## 2015-02-23 NOTE — Interval H&P Note (Signed)
History and Physical Interval Note:  02/23/2015 8:59 AM  Dennis Chase  has presented today for surgery, with the diagnosis of AFIB  The various methods of treatment have been discussed with the patient and family. After consideration of risks, benefits and other options for treatment, the patient has consented to  Procedure(s): TRANSESOPHAGEAL ECHOCARDIOGRAM (TEE) (N/A) CARDIOVERSION (N/A) as a surgical intervention .  The patient's history has been reviewed, patient examined, no change in status, stable for surgery.  I have reviewed the patient's chart and labs.  Questions were answered to the patient's satisfaction.     Dorris Carnes

## 2015-02-23 NOTE — Progress Notes (Signed)
CCMD called and informed RN pt converted back to NSR, strip saved.

## 2015-02-23 NOTE — Progress Notes (Signed)
Discharge instructions completed. Medications reviewed with patient. Patient verbalizes understanding.

## 2015-02-23 NOTE — Progress Notes (Signed)
Subjective:  Feels somewhat better this morning.  Less dyspnea.  Objective:  Vital Signs in the last 24 hours: BP 92/60 mmHg  Pulse 63  Temp(Src) 97.7 F (36.5 C) (Oral)  Resp 18  Ht 5\' 11"  (1.803 m)  Wt 81.239 kg (179 lb 1.6 oz)  BMI 24.99 kg/m2  SpO2 97%  Physical Exam: Pleasant male in no acute distress Lungs:  Clear  Cardiac:  Regular rhythm, normal S1 and S2, no S3 Abdomen:  Soft, nontender, no masses Extremities:  No edema present  Intake/Output from previous day: 05/04 0701 - 05/05 0700 In: 661.8 [P.O.:480; I.V.:181.8] Out: 1150 [Urine:1150] Weight Filed Weights   02/22/15 1223 02/23/15 0515  Weight: 80.151 kg (176 lb 11.2 oz) 81.239 kg (179 lb 1.6 oz)    Lab Results: Basic Metabolic Panel:  Recent Labs  02/22/15 1430  NA 139  K 4.4  CL 105  CO2 23  GLUCOSE 84  BUN 14  CREATININE 0.98    CBC:  Recent Labs  02/22/15 1430  WBC 5.6  NEUTROABS 3.5  HGB 15.1  HCT 43.1  MCV 86.7  PLT 192   Telemetry: Currently normal sinus rhythm.  Converted at approximately 1 AM last night.  Assessment/Plan:  1.  Paroxysmal atrial fibrillation-converted to sinus rhythm 2.  Mildly dilated ascending aorta  Recommendations:  We'll get a noncontrast CT scan to assess the size of the aorta is at baseline.  Echo was otherwise unremarkable.  The aortic valve was tricuspid.  We'll plan to send home later today after CT scan if he ambulates fine.  Discontinue heparin and discharged on aspirin alone since his score was only 0.  Discussed follow up if PAF.  If he has recurrence despite taking flecainide then will consider early ablation.      Kerry Hough  MD [facility]  02/23/2015, 9:44 AM

## 2015-12-11 IMAGING — CR DG CHEST 2V
2 series · 2 of 2 positions shown · non-contrast
Comparison: None.

CLINICAL DATA: Shortness of breath.

EXAM:
CHEST  2 VIEW

[view not recorded (1 of 2)]
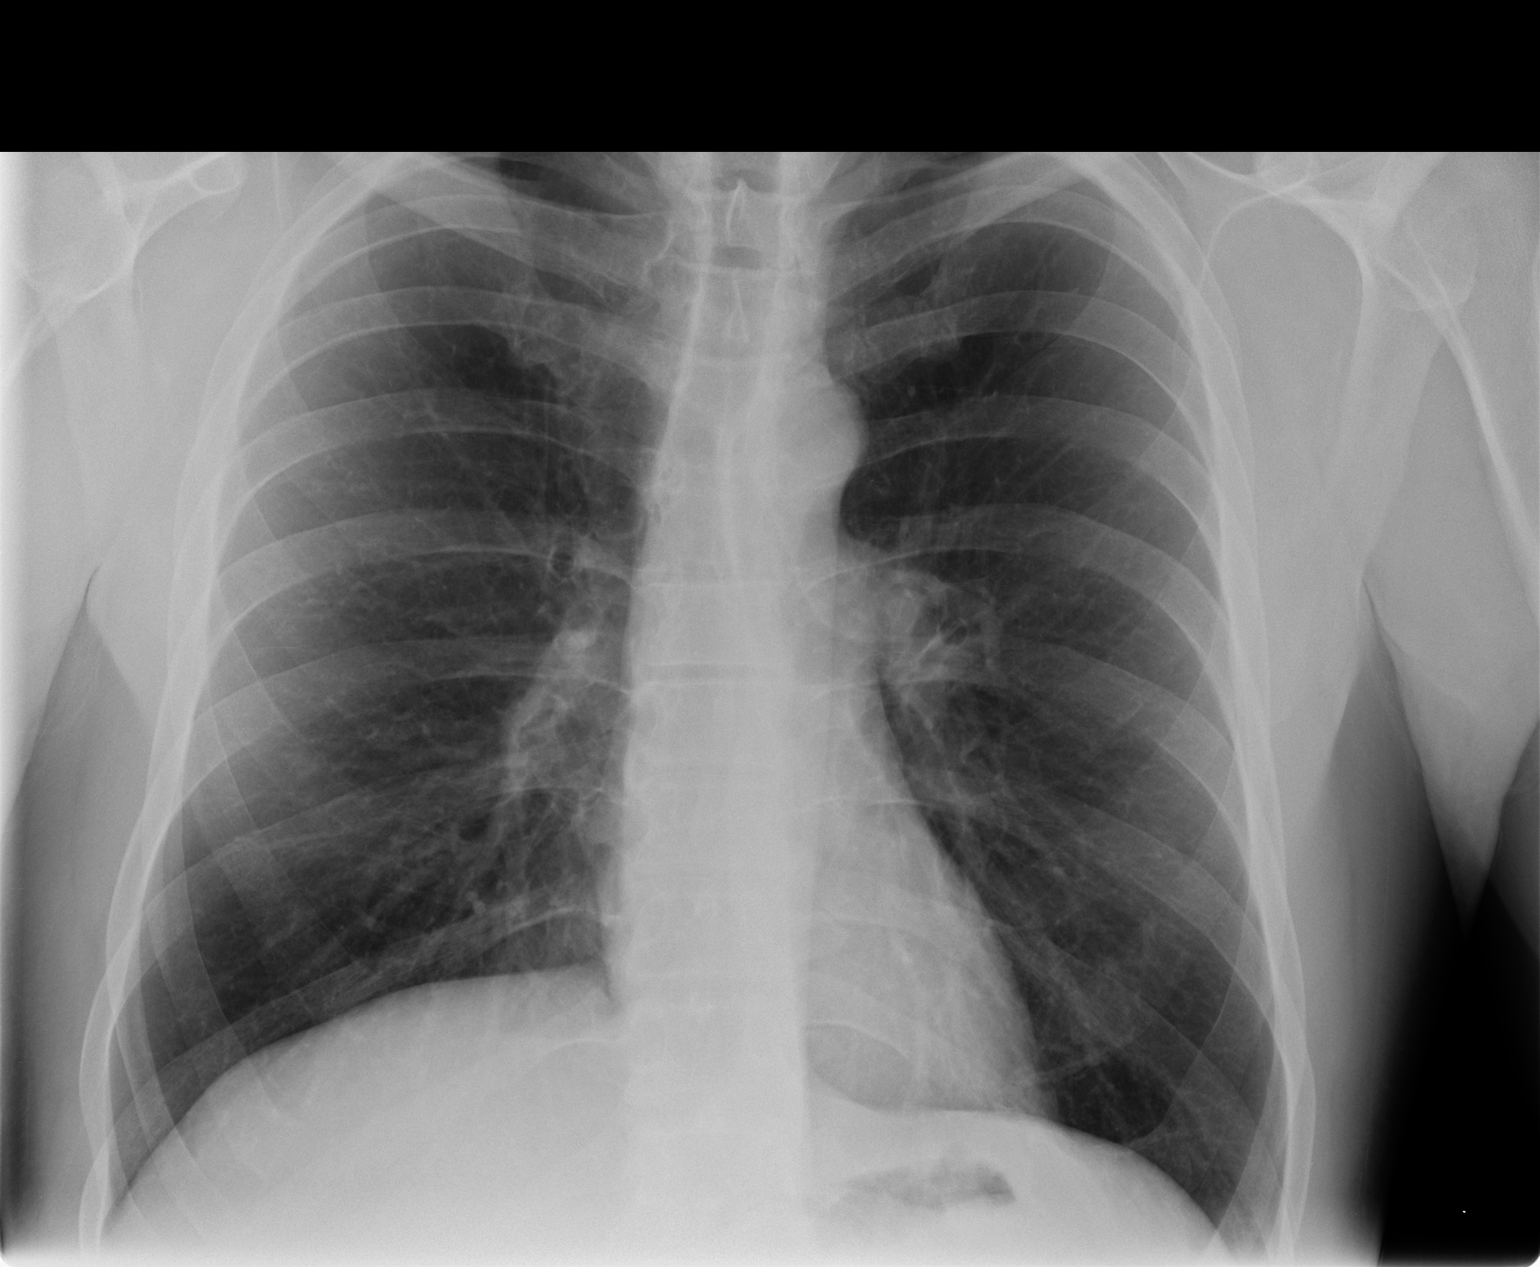

[view not recorded (2 of 2)]
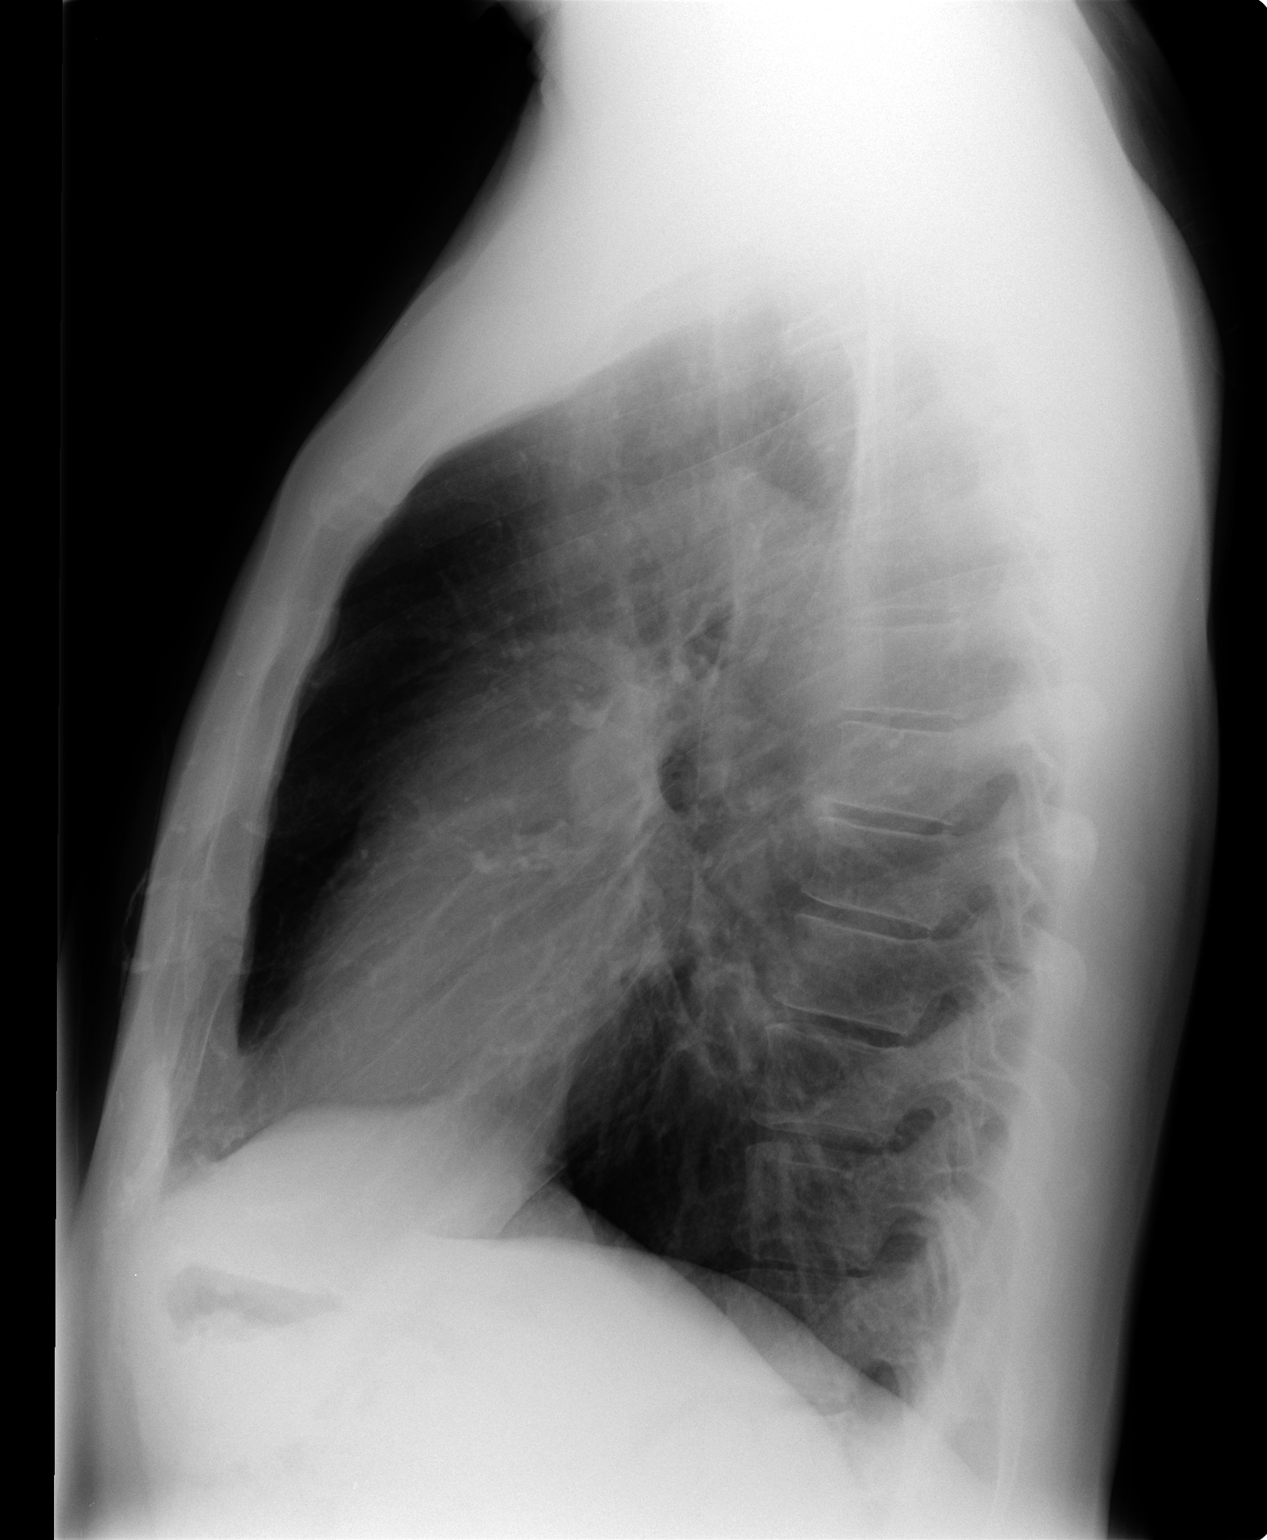

[2 of 2 positions shown; findings below may reference images not displayed]

FINDINGS: Mediastinum hilar structures normal. Lungs are clear. Heart size
normal. No pleural effusion or pneumothorax. No acute bony
abnormality.
IMPRESSION: No acute cardiopulmonary disease.

## 2015-12-23 IMAGING — DX DG CHEST 2V
2 series · 2 of 2 positions shown · non-contrast
Comparison: 02/10/2015

CLINICAL DATA: Atrial fibrillation

EXAM:
CHEST  2 VIEW

[chest pa]
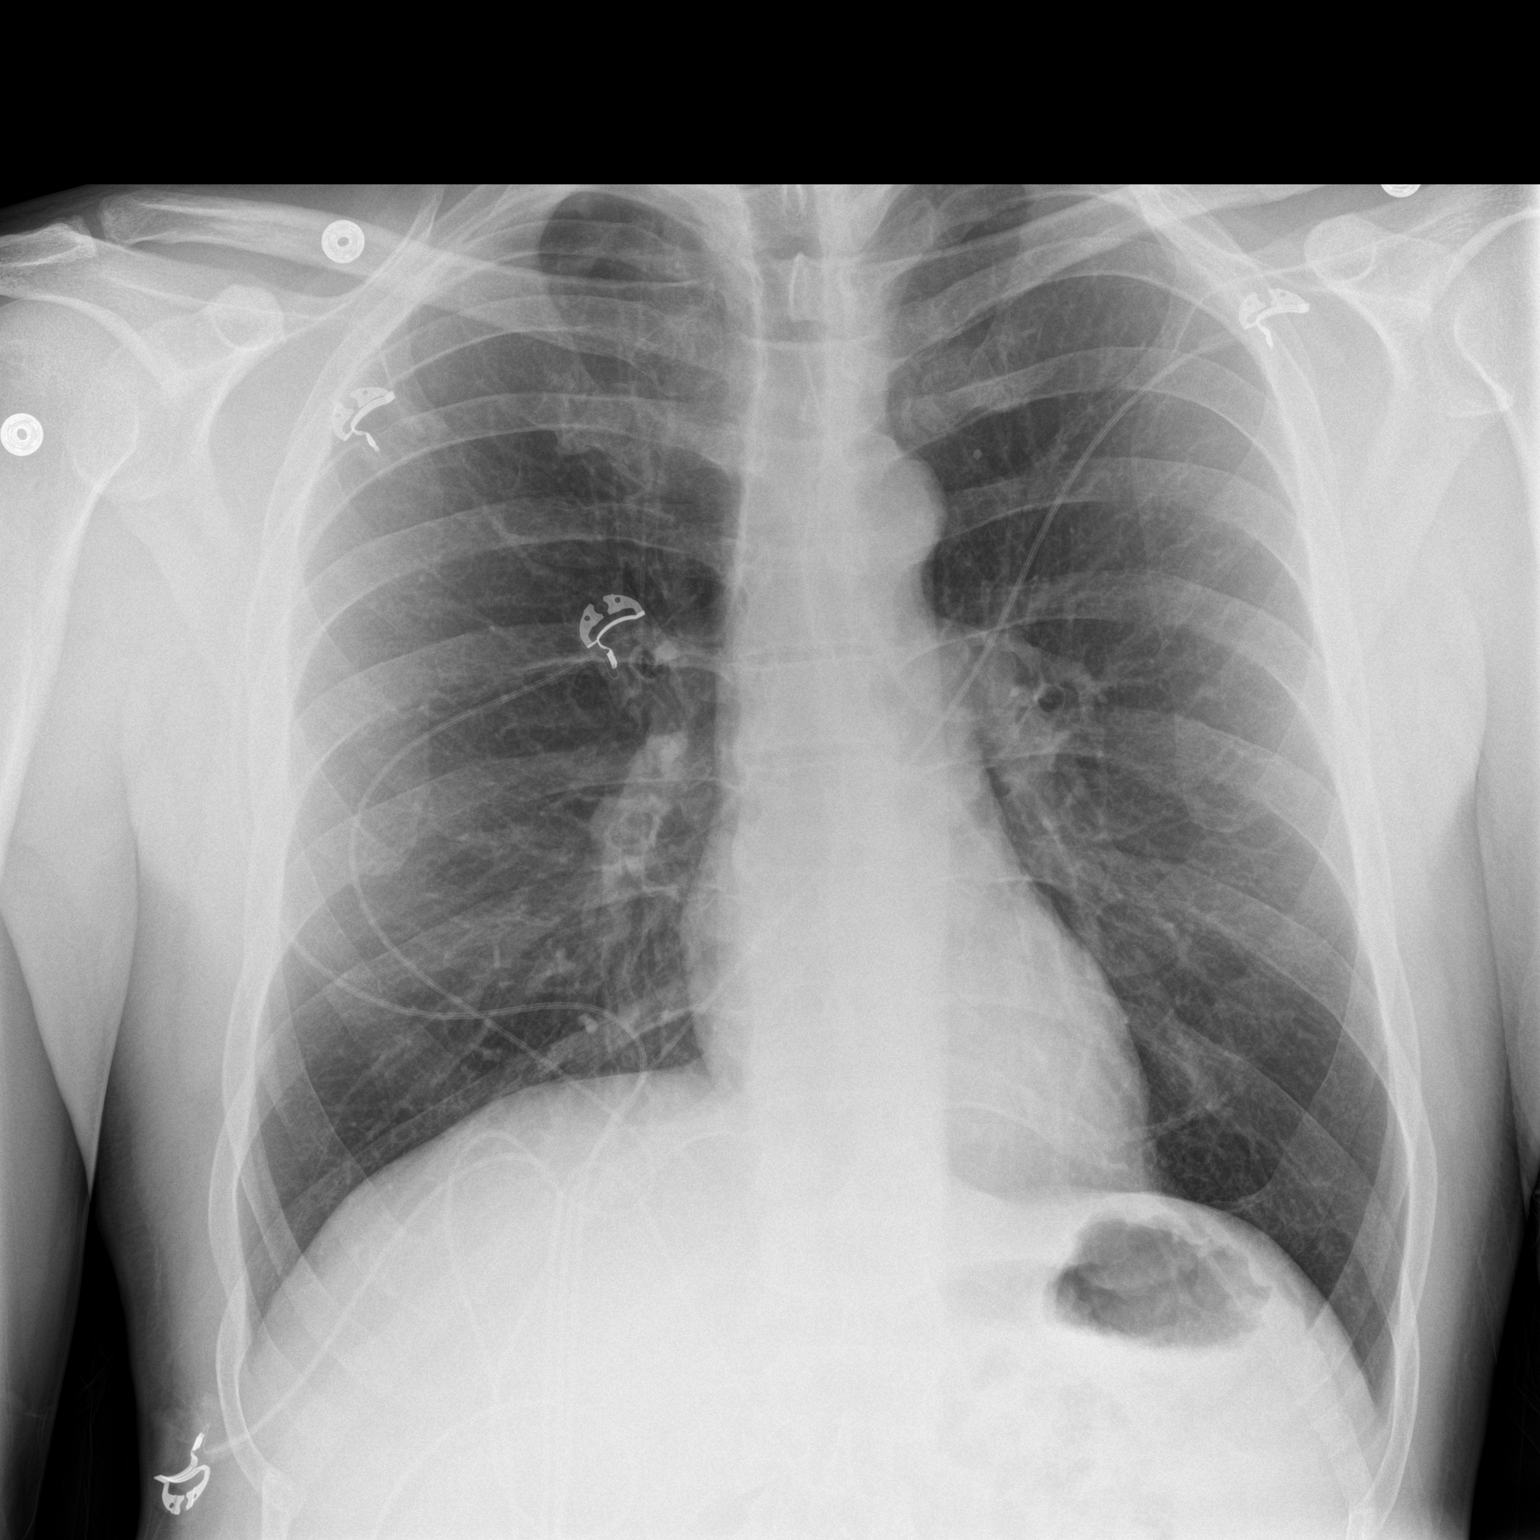

[chest lat]
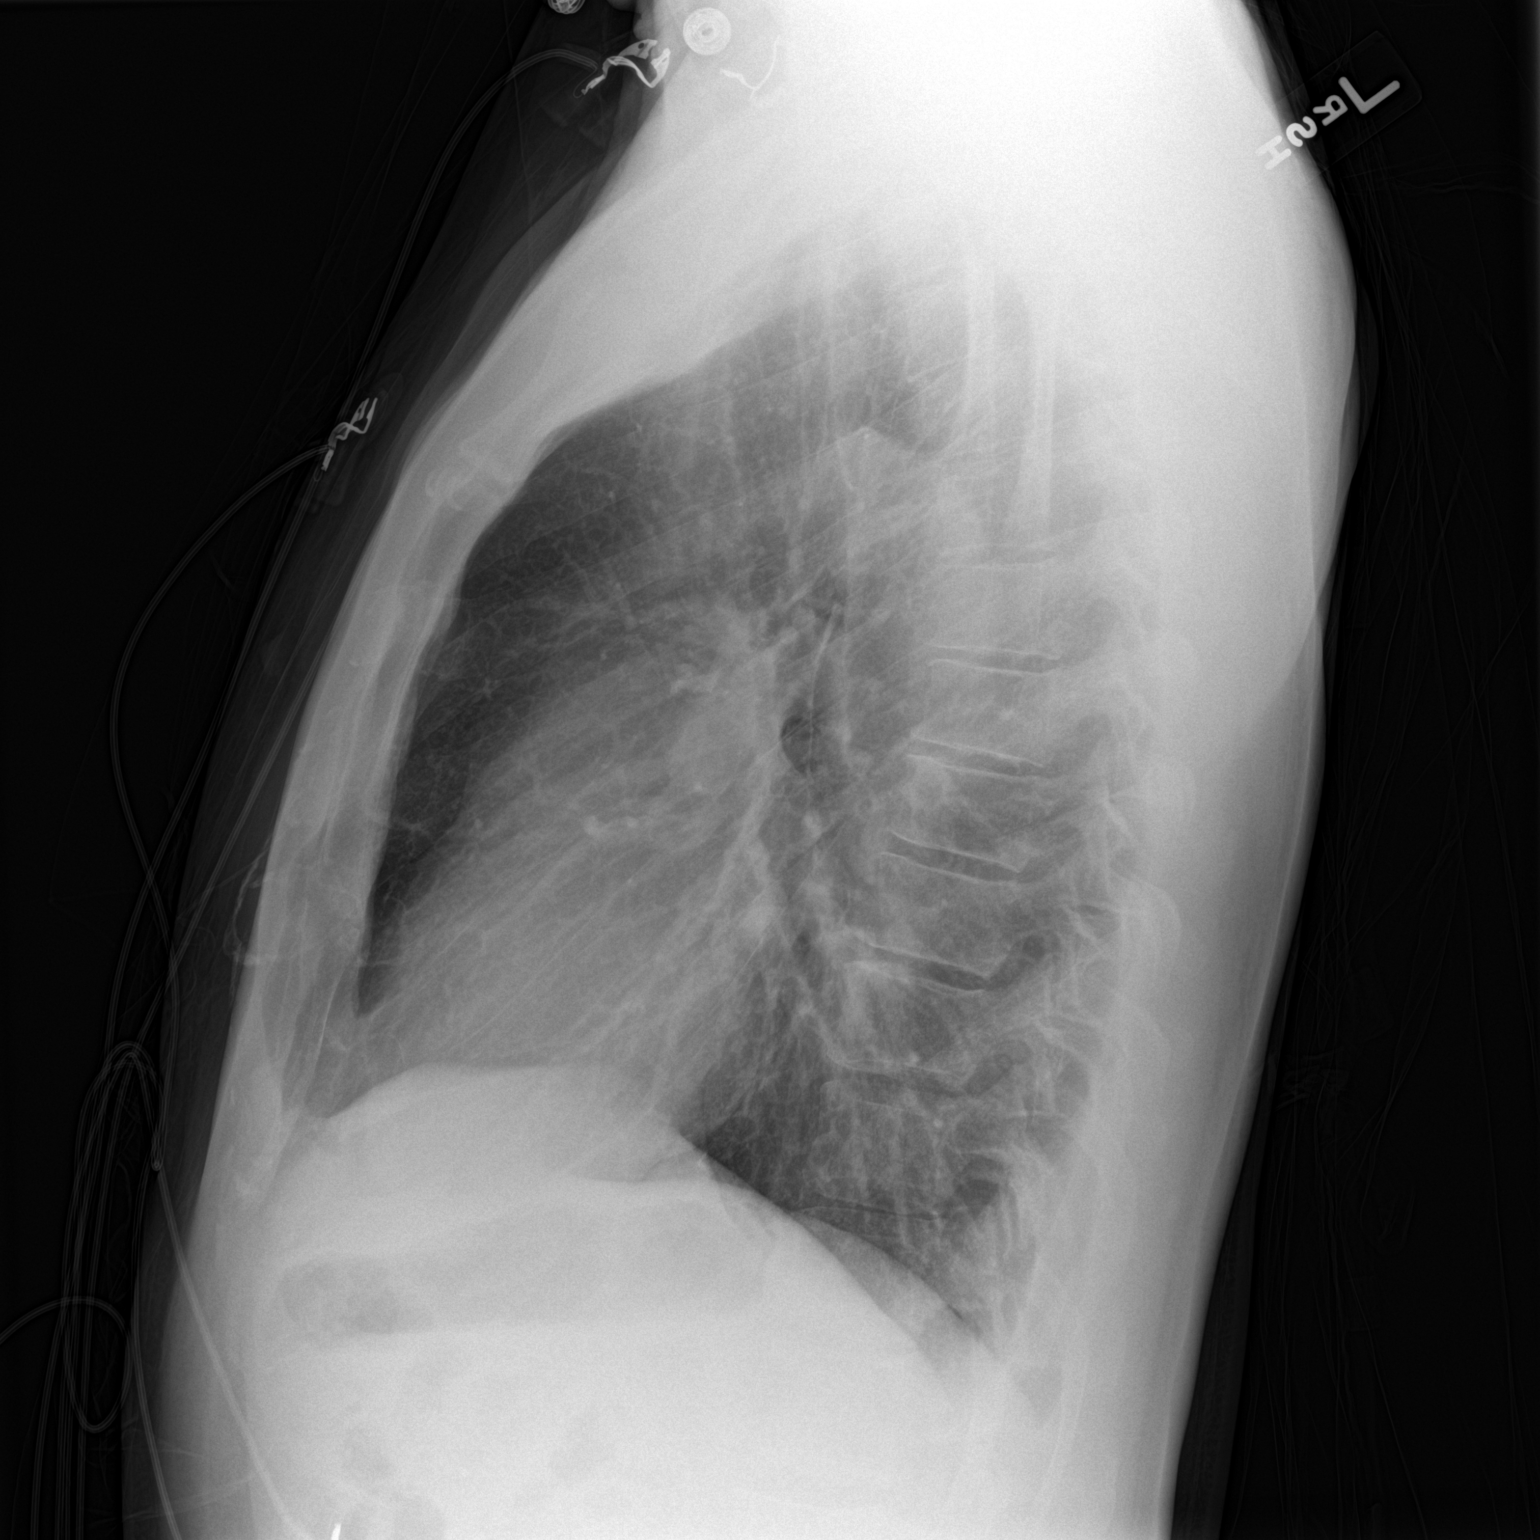

[2 of 2 positions shown; findings below may reference images not displayed]

FINDINGS: The heart size and mediastinal contours are within normal limits.
Both lungs are clear. The visualized skeletal structures are
unremarkable.
IMPRESSION: No active cardiopulmonary disease.

## 2016-08-29 ENCOUNTER — Other Ambulatory Visit: Payer: Self-pay | Admitting: Cardiology

## 2016-08-29 DIAGNOSIS — I712 Thoracic aortic aneurysm, without rupture, unspecified: Secondary | ICD-10-CM

## 2016-09-23 ENCOUNTER — Ambulatory Visit
Admission: RE | Admit: 2016-09-23 | Discharge: 2016-09-23 | Disposition: A | Discharge status: Home / Self Care | Payer: BC Managed Care – PPO | Source: Ambulatory Visit | Attending: Cardiology | Admitting: Cardiology

## 2016-09-23 DIAGNOSIS — I712 Thoracic aortic aneurysm, without rupture, unspecified: Secondary | ICD-10-CM

## 2017-11-25 ENCOUNTER — Other Ambulatory Visit: Payer: Self-pay | Admitting: Cardiology

## 2017-11-25 DIAGNOSIS — I712 Thoracic aortic aneurysm, without rupture, unspecified: Secondary | ICD-10-CM

## 2018-11-20 ENCOUNTER — Telehealth: Payer: Self-pay | Admitting: *Deleted

## 2018-11-20 ENCOUNTER — Encounter: Payer: Self-pay | Admitting: Cardiology

## 2018-11-20 ENCOUNTER — Ambulatory Visit (INDEPENDENT_AMBULATORY_CARE_PROVIDER_SITE_OTHER): Payer: BC Managed Care – PPO | Admitting: Cardiology

## 2018-11-20 VITALS — BP 100/60 | HR 56 | Ht 70.0 in | Wt 181.0 lb

## 2018-11-20 DIAGNOSIS — I480 Paroxysmal atrial fibrillation: Secondary | ICD-10-CM

## 2018-11-20 DIAGNOSIS — Z01812 Encounter for preprocedural laboratory examination: Secondary | ICD-10-CM | POA: Diagnosis not present

## 2018-11-20 DIAGNOSIS — I712 Thoracic aortic aneurysm, without rupture, unspecified: Secondary | ICD-10-CM

## 2018-11-20 DIAGNOSIS — I7121 Aneurysm of the ascending aorta, without rupture: Secondary | ICD-10-CM

## 2018-11-20 DIAGNOSIS — Q231 Congenital insufficiency of aortic valve: Secondary | ICD-10-CM | POA: Diagnosis not present

## 2018-11-20 HISTORY — DX: Thoracic aortic aneurysm, without rupture, unspecified: I71.20

## 2018-11-20 NOTE — Progress Notes (Signed)
Cardiology Office Note:    Date:  11/20/2018   ID:  Dennis Chase, DOB [birth year], MRN [id number]  PCP:  Wenda Low, MD  Cardiologist:  Jenne Campus, MD    Referring MD: No ref. provider found   Chief Complaint  Patient presents with  . Follow-up  Doing well follow-up  History of Present Illness:    Dennis Chase is a 58 y.o. male with bicuspid aortic valve, ascending aortic aneurysm, paroxysmal atrial fibrillation comes today to office for follow-up overall doing well asymptomatic no chest pain tightness squeezing pressure burning chest.  He exercised on the regular basis he right bike to work every day on top of that he likes camping and walking a lot.  He does have dog and walk with dog every single day with no difficulties described to have some palpitation to summertime but he admits that at that time he did not take his medications since that time he takes his medication and things seems to be under control.  Past Medical History:  Diagnosis Date  . Adenocarcinoma of duodenum ([place])   . Complication of anesthesia   . Paroxysmal atrial fibrillation (HCC)   . PONV (postoperative nausea and vomiting)   . Spina bifida of lumbar region [facility])     Past Surgical History:  Procedure Laterality Date  . EXPLORATORY LAPAROTOMY W/ BOWEL RESECTION     resection of duodenum  . FOOT SURGERY     multiple for spinal bifida  . SPINAL FIXATION SURGERY    . VENTRAL HERNIA REPAIR      Current Medications: Current Meds  Medication Sig  . cetirizine (ZYRTEC) 10 MG tablet Take 10 mg by mouth daily.  . Cholecalciferol 1000 UNITS tablet Take 185 mg by mouth daily.  . flecainide (TAMBOCOR) 50 MG tablet Take 1 tablet (50 mg total) by mouth every 12 (twelve) hours.  Marland Kitchen ibuprofen (ADVIL,MOTRIN) 200 MG tablet Take 200 mg by mouth at bedtime as needed. For pain  . metoprolol tartrate (LOPRESSOR) 25 MG tablet Take 1 tablet (25 mg total) by mouth 2 (two) times daily.  Marland Kitchen zolpidem (AMBIEN) 10 MG  tablet Take 5 mg by mouth at bedtime as needed for sleep.      Allergies:   Latex   Social History   Socioeconomic History  . Marital status: Married    Spouse name: Not on file  . Number of children: Not on file  . Years of education: Not on file  . Highest education level: Not on file  Occupational History  . Not on file  Social Needs  . Financial resource strain: Not on file  . Food insecurity:    Worry: Not on file    Inability: Not on file  . Transportation needs:    Medical: Not on file    Non-medical: Not on file  Tobacco Use  . Smoking status: Never Smoker  . Smokeless tobacco: Never Used  Substance and Sexual Activity  . Alcohol use: Yes    Alcohol/week: 0.0 standard drinks    Comment: socially 1-2 times a week  . Drug use: No  . Sexual activity: Yes  Lifestyle  . Physical activity:    Days per week: Not on file    Minutes per session: Not on file  . Stress: Not on file  Relationships  . Social connections:    Talks on phone: Not on file    Gets together: Not on file    Attends religious service: Not on file  Active member of club or organization: Not on file    Attends meetings of clubs or organizations: Not on file    Relationship status: Not on file  Other Topics Concern  . 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 ROS:   Please see the history of present illness.    All 14 point review of systems negative except as described per history of present illness  EKGs/Labs/Other Studies Reviewed:      Recent Labs: No results found for requested labs within last 8760 hours.  Recent Lipid Panel No results found for: CHOL, TRIG, HDL, CHOLHDL, VLDL, LDLCALC, LDLDIRECT  Physical Exam:    VS:  BP 100/60   Pulse (!)  56   Ht 5\' 10"  (1.778 m)   Wt 181 lb (82.1 kg)   SpO2 98%   BMI 25.97 kg/m     Wt Readings from Last 3 Encounters:  11/20/18 181 lb (82.1 kg)  02/23/15 179 lb 1.6 oz (81.2 kg)     GEN:  Well nourished, well developed in no acute distress HEENT: Normal NECK: No JVD; No carotid bruits LYMPHATICS: No lymphadenopathy CARDIAC: RRR, no murmurs, no rubs, no gallops RESPIRATORY:  Clear to auscultation without rales, wheezing or rhonchi  ABDOMEN: Soft, non-tender, non-distended MUSCULOSKELETAL:  No edema; No deformity  SKIN: Warm and dry LOWER EXTREMITIES: no swelling NEUROLOGIC:  Alert and oriented x 3 PSYCHIATRIC:  Normal affect   ASSESSMENT:    1. Paroxysmal atrial fibrillation (HCC)   2. Bicuspid aortic valve   3. Ascending aortic aneurysm (HCC)    PLAN:    In order of problems listed above:  1. Paroxysmal atrial fibrillation rare episode in December when he skipped medications.  And now he is back on medication doing well we will do EKG to check QT interval.  He will also be scheduled to have echocardiogram to look at the left atrial size and more importantly to assess bicuspid aortic valve.  Chads 2 Vascor equals 0 not anticoagulated. 2. Bicuspid aortic valve echocardiogram will be done. 3. Ascending aortic aneurysm we will schedule him to have CT of his chest.  On top of that there was some nodules on the CT.  Need to be followed.   Medication Adjustments/Labs and Tests Ordered: Current medicines are reviewed at length with the patient today.  Concerns regarding medicines are outlined above.  No orders of the defined types were placed in this encounter.  Medication changes: No orders of the defined types were placed in this encounter.   Signed, Park Liter, MD, [facility] 11/20/2018 8:50 AM    [place]

## 2018-11-20 NOTE — Addendum Note (Signed)
Addended by: Aleatha Borer on: 11/20/2018 09:04 AM   Modules accepted: Orders

## 2018-11-20 NOTE — Addendum Note (Signed)
Addended by: Austin Miles on: 11/20/2018 09:52 AM   Modules accepted: Orders

## 2018-11-20 NOTE — Telephone Encounter (Signed)
Left message for patient to return call to discuss CTA chest aorta w/wo contrast and lab work that needs to be done prior to testing.

## 2018-11-20 NOTE — Patient Instructions (Addendum)
Medication Instructions:  Your physician recommends that you continue on your current medications as directed. Please refer to the Current Medication list given to you today.  If you need a refill on your cardiac medications before your next appointment, please call your pharmacy.   Lab work: Your physician recommends that you return for lab work in 1-2 weeks: BMP before CTA chest aorta with contrast.   If you have labs (blood work) drawn today and your tests are completely normal, you will receive your results only by: Marland Kitchen MyChart Message (if you have MyChart) OR . A paper copy in the mail If you have any lab test that is abnormal or we need to change your treatment, we will call you to review the results.  Testing/Procedures: Your physician has requested that you have an echocardiogram. Echocardiography is a painless test that uses sound waves to create images of your heart. It provides your doctor with information about the size and shape of your heart and how well your heart's chambers and valves are working. This procedure takes approximately one hour. There are no restrictions for this procedure.  EKG Today  Non-Cardiac CT Angiography (CTA), is a special type of CT scan that uses a computer to produce multi-dimensional views of major blood vessels throughout the body. In CT angiography, a contrast material is injected through an IV to help visualize the blood vessels  Follow-Up: At [facility], you and your health needs are our priority.  As part of our continuing mission to provide you with exceptional heart care, we have created designated Provider Care Teams.  These Care Teams include your primary Cardiologist (physician) and Advanced Practice Providers (APPs -  Physician Assistants and Nurse Practitioners) who all work together to provide you with the care you need, when you need it. You will need a follow up appointment in 1 years.  Please call our office 2 months in advance to  schedule this appointment.  You may see  Jenne Campus or another member of our [employer] Provider Team in [place]: Shirlee More, MD . Jyl Heinz, MD  Any Other Special Instructions Will Be Listed Below (If Applicable). You will be contacted to schedule your CT after we receive an approval from your insurance company.

## 2018-11-20 NOTE — Telephone Encounter (Signed)
Patient called back, he was advised of the lab work that was needed and that we would schedule him to have his lab at [facility] closer to when he is scheduled for his CT. Also confirmed that he did want the CT at [facility].

## 2018-11-26 ENCOUNTER — Other Ambulatory Visit (HOSPITAL_COMMUNITY): Payer: BC Managed Care – PPO

## 2018-11-26 ENCOUNTER — Ambulatory Visit (HOSPITAL_COMMUNITY): Payer: BC Managed Care – PPO | Attending: Cardiology

## 2018-11-26 DIAGNOSIS — Q231 Congenital insufficiency of aortic valve: Secondary | ICD-10-CM | POA: Diagnosis not present

## 2018-11-26 DIAGNOSIS — I480 Paroxysmal atrial fibrillation: Secondary | ICD-10-CM | POA: Insufficient documentation

## 2018-11-26 DIAGNOSIS — I712 Thoracic aortic aneurysm, without rupture: Secondary | ICD-10-CM | POA: Diagnosis not present

## 2018-11-26 DIAGNOSIS — I7121 Aneurysm of the ascending aorta, without rupture: Secondary | ICD-10-CM

## 2018-11-27 LAB — BASIC METABOLIC PANEL
BUN / CREAT RATIO: 16 (ref 9–20)
BUN: 16 mg/dL (ref 6–24)
CO2: 23 mmol/L (ref 20–29)
Calcium: 9.8 mg/dL (ref 8.7–10.2)
Chloride: 98 mmol/L (ref 96–106)
Creatinine, Ser: 1.01 mg/dL (ref 0.76–1.27)
GFR calc Af Amer: 94 mL/min/{1.73_m2} (ref >59)
GFR calc non Af Amer: 82 mL/min/{1.73_m2} (ref >59)
GLUCOSE: 92 mg/dL (ref 65–99)
POTASSIUM: 5 mmol/L (ref 3.5–5.2)
Sodium: 139 mmol/L (ref 134–144)

## 2018-12-07 ENCOUNTER — Telehealth: Payer: Self-pay | Admitting: Cardiology

## 2018-12-07 MED ORDER — METOPROLOL TARTRATE 25 MG PO TABS: 25.0000 mg | ORAL_TABLET | Freq: Two times a day (BID) | ORAL | 1 refills | Status: DC

## 2018-12-07 MED ORDER — FLECAINIDE ACETATE 50 MG PO TABS: 50.0000 mg | ORAL_TABLET | Freq: Two times a day (BID) | ORAL | 1 refills | Status: DC

## 2018-12-07 NOTE — Telephone Encounter (Signed)
Metoprolol tartrate 25 mg twice daily and flecainide 50 mg every 12 hours refilled.

## 2018-12-07 NOTE — Telephone Encounter (Signed)
° ° °  1. Which medications need to be refilled? (please list name of each medication and dose if known) flecainide 50mg  and metoprolol tartrate 25mg   2. Which pharmacy/location (including street and city if local pharmacy) is medication to be sent to? [facility] on [address] in [place]  3. Do they need a 30 day or 90 day supply? [place]

## 2019-07-09 ENCOUNTER — Other Ambulatory Visit: Payer: Self-pay | Admitting: Cardiology

## 2019-07-19 ENCOUNTER — Telehealth: Payer: Self-pay | Admitting: Cardiology

## 2019-07-19 NOTE — Telephone Encounter (Signed)
Patient went into afib over weekend and wonders if this is normal, Please call patient

## 2019-07-19 NOTE — Telephone Encounter (Signed)
Called patient. He reports over the weekend he went into afib. He had chest palpitations, a irregular pulse, and his pulse rate on his pulse oximeter was all over the place, up and down. He took a extra dose of flecainide 50 mg Friday night, Saturday morning, and Saturday evening and a extra dose of Metoprolol 25 mg Friday night, Saturday morning, and Saturday night. Since then he has felt fine. He wanted to inform Dr. Agustin Cree of this to see if he needs to adjust his medications to prevent this from happening again. Will consult with Dr. Agustin Cree.

## 2019-07-20 NOTE — Telephone Encounter (Signed)
Left message for patient to return call.

## 2019-07-22 NOTE — Telephone Encounter (Signed)
Left another message for patient to return call.

## 2019-07-22 NOTE — Telephone Encounter (Signed)
Called patient back. I advised him per Dr. Agustin Cree to make an appointment with [place] so he can be assessed an we do a ekg to check his rhythm. He reports he feels fine now, just had eye surgery and can't come in now. He reports he will let us know if anything changes or gets worse but right now he feels okay. Will make Dr. Agustin Cree aware.

## 2019-10-11 ENCOUNTER — Other Ambulatory Visit: Payer: Self-pay | Admitting: Cardiology

## 2020-01-11 ENCOUNTER — Other Ambulatory Visit: Payer: Self-pay

## 2020-01-11 ENCOUNTER — Encounter: Payer: Self-pay | Admitting: Cardiology

## 2020-01-11 ENCOUNTER — Ambulatory Visit: Payer: BC Managed Care – PPO | Admitting: Cardiology

## 2020-01-11 VITALS — BP 110/66 | HR 58 | Ht 70.0 in | Wt 191.0 lb

## 2020-01-11 DIAGNOSIS — I480 Paroxysmal atrial fibrillation: Secondary | ICD-10-CM

## 2020-01-11 DIAGNOSIS — I712 Thoracic aortic aneurysm, without rupture: Secondary | ICD-10-CM | POA: Diagnosis not present

## 2020-01-11 DIAGNOSIS — I7121 Aneurysm of the ascending aorta, without rupture: Secondary | ICD-10-CM

## 2020-01-11 DIAGNOSIS — N529 Male erectile dysfunction, unspecified: Secondary | ICD-10-CM | POA: Insufficient documentation

## 2020-01-11 NOTE — Patient Instructions (Signed)
Medication Instructions:  Your physician has recommended you make the following change in your medication:  1.  STOP the Metoprolol  *If you need a refill on your cardiac medications before your next appointment, please call your pharmacy*   Lab Work: None ordered  If you have labs (blood work) drawn today and your tests are completely normal, you will receive your results only by: Marland Kitchen MyChart Message (if you have MyChart) OR . A paper copy in the mail If you have any lab test that is abnormal or we need to change your treatment, we will call you to review the results.   Testing/Procedures: Your physician has requested that you have an echocardiogram. Echocardiography is a painless test that uses sound waves to create images of your heart. It provides your doctor with information about the size and shape of your heart and how well your heart's chambers and valves are working. This procedure takes approximately one hour. There are no restrictions for this procedure.     Follow-Up: At [facility], you and your health needs are our priority.  As part of our continuing mission to provide you with exceptional heart care, we have created designated Provider Care Teams.  These Care Teams include your primary Cardiologist (physician) and Advanced Practice Providers (APPs -  Physician Assistants and Nurse Practitioners) who all work together to provide you with the care you need, when you need it.  We recommend signing up for the patient portal called "MyChart".  Sign up information is provided on this After Visit Summary.  MyChart is used to connect with patients for Virtual Visits (Telemedicine).  Patients are able to view lab/test results, encounter notes, upcoming appointments, etc.  Non-urgent messages can be sent to your provider as well.   To learn more about what you can do with MyChart, go to [email or web address].    Your next appointment:   12 month(s)  The format for your next  appointment:   In Person  Provider:   Jenne Campus, MD   Other Instructions  Echocardiogram An echocardiogram is a procedure that uses painless sound waves (ultrasound) to produce an image of the heart. Images from an echocardiogram can provide important information about:  Signs of coronary artery disease (CAD).  Aneurysm detection. An aneurysm is a weak or damaged part of an artery wall that bulges out from the normal force of blood pumping through the body.  Heart size and shape. Changes in the size or shape of the heart can be associated with certain conditions, including heart failure, aneurysm, and CAD.  Heart muscle function.  Heart valve function.  Signs of a past heart attack.  Fluid buildup around the heart.  Thickening of the heart muscle.  A tumor or infectious growth around the heart valves. Tell a health care provider about:  Any allergies you have.  All medicines you are taking, including vitamins, herbs, eye drops, creams, and over-the-counter medicines.  Any blood disorders you have.  Any surgeries you have had.  Any medical conditions you have.  Whether you are pregnant or may be pregnant. What are the risks? Generally, this is a safe procedure. However, problems may occur, including:  Allergic reaction to dye (contrast) that may be used during the procedure. What happens before the procedure? No specific preparation is needed. You may eat and drink normally. What happens during the procedure?   An IV tube may be inserted into one of your veins.  You may receive contrast through this tube.  A contrast is an injection that improves the quality of the pictures from your heart.  A gel will be applied to your chest.  A wand-like tool (transducer) will be moved over your chest. The gel will help to transmit the sound waves from the transducer.  The sound waves will harmlessly bounce off of your heart to allow the heart images to be captured  in real-time motion. The images will be recorded on a computer. The procedure may vary among health care providers and hospitals. What happens after the procedure?  You may return to your normal, everyday life, including diet, activities, and medicines, unless your health care provider tells you not to do that. Summary  An echocardiogram is a procedure that uses painless sound waves (ultrasound) to produce an image of the heart.  Images from an echocardiogram can provide important information about the size and shape of your heart, heart muscle function, heart valve function, and fluid buildup around your heart.  You do not need to do anything to prepare before this procedure. You may eat and drink normally.  After the echocardiogram is completed, you may return to your normal, everyday life, unless your health care provider tells you not to do that. This information is not intended to replace advice given to you by your health care provider. Make sure you discuss any questions you have with your health care provider. Document Revised: 01/28/2019 Document Reviewed: 11/09/2016 Elsevier Patient Education  [place].

## 2020-01-11 NOTE — Progress Notes (Signed)
Cardiology Office Note:    Date:  01/11/2020   ID:  Clovis Pu, DOB [birth year], MRN [id number]  PCP:  Wenda Low, MD  Cardiologist:  Jenne Campus, MD    Referring MD: Wenda Low, MD   No chief complaint on file. Doing well  History of Present Illness:    Dennis Chase is a 59 y.o. male past medical history significant for paroxysmal atrial fibrillation, successfully suppressed with Tambocor.  His chads 2 Vascor equals 0, he is taking aspirin only.  Comes today 2 months of follow-up he said within the last year he got maybe 2 episodes of palpitations only lasting short.  Time.  Denies having any chest pain tightness squeezing pressure burning chest overall doing well.  Still trying to be active.  He got new complaint he complained of having erectile dysfunction and he read that it may be related to beta-blocker.  Past Medical History:  Diagnosis Date  . 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 Complication of anesthesia   . Paroxysmal atrial fibrillation (HCC)   . PONV (postoperative nausea and vomiting)   . Spina bifida of lumbar region [facility])     Past Surgical History:  Procedure Laterality Date  . EXPLORATORY LAPAROTOMY W/ BOWEL RESECTION     resection of duodenum  . FOOT SURGERY     multiple for spinal bifida  . SPINAL FIXATION SURGERY    . VENTRAL HERNIA REPAIR      Current Medications: Current Meds  Medication Sig  . aspirin EC 81 MG tablet Take 81 mg by mouth daily.  . cetirizine (ZYRTEC) 10 MG tablet Take 10 mg by mouth daily.  . flecainide (TAMBOCOR) 50 MG tablet TAKE 1 TABLET(50 MG) BY MOUTH EVERY 12 HOURS  . ibuprofen (ADVIL,MOTRIN) 200 MG tablet Take 200 mg by mouth at bedtime as needed. For pain  . zolpidem (AMBIEN) 10 MG tablet Take 5 mg by mouth at bedtime as needed for sleep.   . [DISCONTINUED] metoprolol tartrate (LOPRESSOR) 25 MG tablet TAKE 1 TABLET(25 MG) BY MOUTH TWICE DAILY     Allergies:   Latex   Social History   Socioeconomic History   . Marital status: Married    Spouse name: Not on file  . Number of children: Not on file  . Years of education: Not on file  . Highest education level: Not on file  Occupational History  . Not on file  Tobacco Use  . Smoking status: Never Smoker  . Smokeless tobacco: Never Used  Substance and Sexual Activity  . Alcohol use: Yes    Alcohol/week: 0.0 standard drinks    Comment: socially 1-2 times a week  . Drug use: No  . Sexual activity: Yes  Other Topics Concern  . 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 Difficulty of Paying Living Expenses:   Food Insecurity:   . Worried About Charity fundraiser in the Last Year:   . Arboriculturist in the Last Year:   Transportation Needs:   . Film/video editor (Medical):   Marland Kitchen Lack of Transportation (Non-Medical):   Physical Activity:   . Days of Exercise per Week:   . Minutes of Exercise per Session:   Stress:   . Feeling of Stress :   Social Connections:   . Frequency of Communication with Friends and Family:   .  Frequency of Social Gatherings with Friends and Family:   . Attends Religious Services:   . Active Member of Clubs or Organizations:   . Attends Archivist Meetings:   Marland Kitchen Marital Status:      Family History: The patient's family history includes Atrial fibrillation in his brother, brother, and brother; Bradycardia in his father; Emphysema in his mother; Stroke in his father. ROS:   Please see the history of present illness.    All 14 point review of systems negative except as described per history of present illness  EKGs/Labs/Other Studies Reviewed:      Recent Labs: No results found for requested labs within last 8760 hours.  Recent Lipid Panel No results found for: CHOL, TRIG, HDL, CHOLHDL, VLDL, LDLCALC, LDLDIRECT  Physical Exam:    VS:  BP 110/66   Pulse (!) 58   Ht 5\' 10"   (1.778 m)   Wt 191 lb (86.6 kg)   BMI 27.41 kg/m     Wt Readings from Last 3 Encounters:  01/11/20 191 lb (86.6 kg)  11/20/18 181 lb (82.1 kg)  02/23/15 179 lb 1.6 oz (81.2 kg)     GEN:  Well nourished, well developed in no acute distress HEENT: Normal NECK: No JVD; No carotid bruits LYMPHATICS: No lymphadenopathy CARDIAC: RRR, no murmurs, no rubs, no gallops RESPIRATORY:  Clear to auscultation without rales, wheezing or rhonchi  ABDOMEN: Soft, non-tender, non-distended MUSCULOSKELETAL:  No edema; No deformity  SKIN: Warm and dry LOWER EXTREMITIES: no swelling NEUROLOGIC:  Alert and oriented x 3 PSYCHIATRIC:  Normal affect   ASSESSMENT:    1. Paroxysmal atrial fibrillation (HCC)   2. Ascending aortic aneurysm (HCC)    PLAN:    In order of problems listed above:  1. Paroxysmal atrial fibrillation.  Very rare episodes on flecainide.  EKG done today showed normal sinus rhythm actually sinus bradycardia rate of 58, normal P interval, normal QT interval.  We will continue present management.  Not anticoagulated since his chads 2 Vascor equals 0. 2. Ascending aortic aneurysm.  I will schedule him to have repeated echocardiogram to look at the size of the aneurysm.  Based on CT the only localization of his aneurysm is ascending aorta Erectile dysfunction.  We decided to stop beta-blocker and see if that helps.  I told him however to use of beta-blocker on as-needed basis for palpitations.  Medication Adjustments/Labs and Tests Ordered: Current medicines are reviewed at length with the patient today.  Concerns regarding medicines are outlined above.  Orders Placed This Encounter  Procedures  . EKG 12-Lead  . ECHOCARDIOGRAM COMPLETE   Medication changes: No orders of the defined types were placed in this encounter.   Signed, Park Liter, MD, [facility] 01/11/2020 4:50 PM    [place]

## 2020-01-17 ENCOUNTER — Other Ambulatory Visit: Payer: Self-pay | Admitting: Cardiology

## 2020-01-17 ENCOUNTER — Ambulatory Visit (HOSPITAL_COMMUNITY): Payer: BC Managed Care – PPO

## 2020-08-02 ENCOUNTER — Telehealth: Payer: Self-pay | Admitting: Cardiology

## 2020-08-02 NOTE — Telephone Encounter (Signed)
Patient c/o Palpitations:  High priority if patient c/o lightheadedness, shortness of breath, or chest pain  How long have you had palpitations/irregular HR/ Afib? Are you having the symptoms now?  Off and on for about two weeks, yes having symptoms right now  1) Are you currently experiencing lightheadedness, SOB or CP?   Feeling his heart in his chest beating not regularly, sometimes short of breath  2) Do you have a history of afib (atrial fibrillation) or irregular heart rhythm?   Yes  3) Have you checked your BP or HR? (document readings if available):   No, will check heart rate when he gets home   4) Are you experiencing any other symptoms?   When it's at it's worst he gets some dizziness and has had numbness in his extremities once.

## 2020-08-02 NOTE — Telephone Encounter (Signed)
I am at [facility] next week and I am happy to see in any of the days that I am there you can double book.

## 2020-08-02 NOTE — Telephone Encounter (Signed)
Called patient. He reports for the past couple of weeks he has been feeling his heart go in and out of afib. He will feel it slow down then beat really fast, during this he has shortness of breath sometimes and dizziness. He denies any pain. He is currently taking flecainide 50 mg every 12 hours. Patient reports he has been taking this for years. Just recently within past two weeks these sensations are stronger and more noticeable. He would like to know what can be done. Will  check with dod.

## 2020-08-02 NOTE — Telephone Encounter (Signed)
Left message for patient to return call.

## 2020-08-02 NOTE — Telephone Encounter (Signed)
To good options, the first is to apply a ZIO monitor for a week and follow-up afterwards with Dr. Raliegh Ip.  The other option is for some in my schedule tomorrow we can do an office EKG check a flecainide level and can place a monitor at that time.  I am good with his choice.

## 2020-08-02 NOTE — Telephone Encounter (Signed)
Called patient informed him of Dr. Joya Gaskins recommendation. He would like to have monitor sent to him. Will register and send. Advised patient we will send it to him and if things get worse to call back and let us know.

## 2020-08-02 NOTE — Telephone Encounter (Signed)
Called patient back to make him an appointment to follow up with Dr. Agustin Cree after wearing monitor. Nothing available in [place] with Dr. Agustin Cree until November, patient not able to do dates in November. Asked him if I could schedule in [place] he said no that was too far. Patient still upset that he was even scheduled with Dr. Agustin Cree anyway- he was previously a Dr. Wynonia Lawman patient and now even seeing Dr. Agustin Cree in [facility] is almost and hour drive for him. Due to this difficulty he just wanted to wait until Dr. Agustin Cree returned and get his perspective on this. He does not want a monitor now. I offered appointment with Dr. Bettina Gavia in [place] tomorrow, he declined. He states he is going to take a extra flecainide so he will be taking 100 mg twice daily now because he said Dr. Agustin Cree or Dr. Wynonia Lawman told him before he could do this. Clarified that I was not advising this and went over Dr. Arloa Koh recommendation. He understood he was deciding to do this himself. Will send to Dr. Agustin Cree for him to review and will also let Dr. Bettina Gavia know.

## 2020-08-03 ENCOUNTER — Telehealth: Payer: Self-pay | Admitting: Cardiology

## 2020-08-03 NOTE — Telephone Encounter (Signed)
He is looking into getting a cardiologist in [place] since it is closer.

## 2020-08-03 NOTE — Telephone Encounter (Signed)
Patient following up.

## 2020-08-03 NOTE — Telephone Encounter (Signed)
Dennis Chase is requesting a Provider switch from Dr. Agustin Cree to Dr. Johney Frame due to being located in [place] and no longer wanting to travel to [place] or [place] for heart care. Please advise.

## 2020-08-03 NOTE — Telephone Encounter (Signed)
That works for me. Thank you for checking!

## 2020-08-03 NOTE — Telephone Encounter (Signed)
Called patient back informed him of Dr. Joya Gaskins note below. He still doesn't want appointment [place] is still too far. He will call if he changes his mind. No further questions.

## 2020-08-04 NOTE — Telephone Encounter (Signed)
Fine with me

## 2020-08-24 NOTE — Progress Notes (Signed)
Cardiology Office Note:    Date:  08/25/2020   ID:  Clovis Pu, DOB 1961-02-24, MRN [id number]  PCP:  Wenda Low, MD  [facility] Cardiologist:  No primary care provider on file.  [facility] Electrophysiologist:  None   Referring MD: Wenda Low, MD     History of Present Illness:    Dennis Chase is a 59 y.o. male with a hx of pAfib not on AC and ascending aortic aneurysm who presents to clinic for follow-up of his Afib.  The patient was last seen by Dr. Agustin Cree in 12/2019. He has been maintained on flecainide for his Afib and has been doing well. Not on AC due to CHADs-vasc score of 0.  Patient states that he started having regular bouts of Afib about a month ago. He has been taking extra doses flecainide and just recently doubled his dose of flecainde to 100mg  BID. Since doubling the dose, he has had no recurrent symptoms.   Of note, patient is very symptomatic with Afib and feels chest tightness, SOB, fatigued, and lightheaded. No syncope. No known triggers. Exercises regularly and bikes 60-44miles per week and no symptoms. He is able to closely monitor when he goes in Afib and states that the new dose of flecainide has been effective (started this dose about 1 month ago).  ECG today with NSR; QRS 104 on 100mg  BID of flecainide  TTE with normal BiV function. Normal atrial size.  TC 177, HDL 84, LDL 85, TG 38, Cr 0.98, HgB 14.4.  Family: Father-Afib; Mother-Afib; Brothers-Afib  Past Medical History:  Diagnosis Date  . 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 Complication of anesthesia   . Paroxysmal atrial fibrillation (HCC)   . PONV (postoperative nausea and vomiting)   . Spina bifida of lumbar region [facility])     Past Surgical History:  Procedure Laterality Date  . EXPLORATORY LAPAROTOMY W/ BOWEL RESECTION     resection of duodenum  . FOOT SURGERY     multiple for spinal bifida  . SPINAL FIXATION SURGERY    . VENTRAL HERNIA REPAIR      Current  Medications: Current Meds  Medication Sig  . aspirin EC 81 MG tablet Take 81 mg by mouth daily.  . cetirizine (ZYRTEC) 10 MG tablet Take 10 mg by mouth as needed.   . flecainide (TAMBOCOR) 50 MG tablet Take 2 tablets (100 mg total) by mouth 2 (two) times daily.  Marland Kitchen ibuprofen (ADVIL,MOTRIN) 200 MG tablet Take 200 mg by mouth at bedtime as needed. For pain  . nitrofurantoin, macrocrystal-monohydrate, (MACROBID) 100 MG capsule Take 100 mg by mouth 2 (two) times daily.  Marland Kitchen zolpidem (AMBIEN) 10 MG tablet Take 5 mg by mouth at bedtime as needed for sleep.   . [DISCONTINUED] flecainide (TAMBOCOR) 50 MG tablet TAKE 1 TABLET(50 MG) BY MOUTH EVERY 12 HOURS (Patient taking differently: 100 mg daily. )     Allergies:   Latex   Social History   Socioeconomic History  . Marital status: Married    Spouse name: Not on file  . Number of children: Not on file  . Years of education: Not on file  . Highest education level: Not on file  Occupational History  . Not on file  Tobacco Use  . Smoking status: Never Smoker  . Smokeless tobacco: Never Used  Vaping Use  . Vaping Use: Never used  Substance and Sexual Activity  . Alcohol use: Yes    Alcohol/week: 0.0 standard drinks  Comment: socially 1-2 times a week  . Drug use: No  . Sexual activity: Yes  Other Topics Concern  . 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 Difficulty of Paying Living Expenses: Not on file  Food Insecurity:   . Worried About Charity fundraiser in the Last Year: Not on file  . Ran Out of Food in the Last Year: Not on file  Transportation Needs:   . Lack of Transportation (Medical): Not on file  . Lack of Transportation (Non-Medical): Not on file  Physical Activity:   . Days of Exercise per Week: Not on file  . Minutes of Exercise per Session: Not on file  Stress:   . Feeling of Stress : Not on file    Social Connections:   . Frequency of Communication with Friends and Family: Not on file  . Frequency of Social Gatherings with Friends and Family: Not on file  . Attends Religious Services: Not on file  . Active Member of Clubs or Organizations: Not on file  . Attends Archivist Meetings: Not on file  . Marital Status: Not on file     Family History: The patient's family history includes Atrial fibrillation in his brother, brother, and brother; Bradycardia in his father; Emphysema in his mother; Stroke in his father.  ROS:   Please see the history of present illness.    Review of Systems  Constitutional: Negative for chills, diaphoresis and fever.  HENT: Negative for hearing loss.   Eyes: Negative for blurred vision.  Respiratory: Negative for cough.   Cardiovascular: Negative for chest pain, palpitations, orthopnea, claudication, leg swelling and PND.  Gastrointestinal: Negative for blood in stool.  Genitourinary: Negative for hematuria.  Musculoskeletal: Negative for myalgias.  Neurological: Negative for dizziness and loss of consciousness.  Psychiatric/Behavioral: Negative for substance abuse.    EKGs/Labs/Other Studies Reviewed:    The following studies were reviewed today: CT Chest 09/23/16: FINDINGS: Cardiovascular: Normal heart size. Mild aortic atherosclerosis. No pericardial effusion. At the level of the sinuses of Valsalva the ascending aorta measures 4.2 mm, image 59 of series 3. At the sinotubular junction this measures 3.4 cm and a few cm above the sino-tubular junction this measures 3.6 cm. Not significantly changed from previous exam. Normal caliber of the great vessels.  Mediastinum/Nodes: No enlarged mediastinal or axillary lymph nodes. Thyroid gland, trachea, and esophagus demonstrate no significant findings.  Lungs/Pleura: Lungs are clear. No pleural effusion or pneumothorax.3 mm pulmonary nodule in the right upper lobe is unchanged from  the previous exam, image 60 series 5. 5 mm left lower lobe lung nodule is stable from previous exam, image 119 of series 5. Calcified granuloma identified within the lingula. 5 mm right lower lobe lung nodule go is new from previous exam, image 98 of series 5.  Upper Abdomen: No acute abnormality identified. 3.1 cm cyst in the left lobe of liver is unchanged.  Musculoskeletal: No chest wall mass or suspicious bone lesions identified.  IMPRESSION: 1. There is mild aneurysmal dilatation of the ascending thoracic aorta at the level of the sinuses of Valsalva measuring 4.2 mm. Recommend annual imaging followup by CTA or MRA. This recommendation follows 2010 ACCF/AHA/AATS/ACR/ASA/SCA/SCAI/SIR/STS/SVM Guidelines for the Diagnosis and Management of Patients with Thoracic Aortic Disease. Circulation. 2010; 121: W299-B716 2. Previously noted small pulmonary nodules are stable from previous exam likely representing  a benign abnormality. 3. New 5 mm right lower lobe lung nodule is identified. No follow-up needed if patient is low-risk. Non-contrast chest CT can be considered in 12 months if patient is high-risk. This recommendation follows the consensus statement: Guidelines for Management of Incidental Pulmonary Nodules Detected on CT Images: From the Fleischner Society 2017; Radiology 2017; 284:228-243.  TTE 11/2018: 1. The left ventricle has normal systolic function of 84-69%. The cavity  size was normal. There is no increased left ventricular wall thickness.  Echo evidence of normal diastolic relaxation.  2. The right ventricle has normal systolic function. The cavity was  normal. There is no increase in right ventricular wall thickness. Right  ventricular systolic pressure normal with an estimated pressure of 20.3  mmHg.  3. The mitral valve is normal in structure.  4. The tricuspid valve is normal in structure.  5. The aortic valve is tricuspid.  6. The pulmonic valve was  normal in structure.  7. There is mild dilatation of the ascending aorta.  EKG:  EKG is  ordered today.  The ekg ordered today demonstrates NSR with HR 61, QRS 178ms/PR 141ms/QTc400ms on flecainide 100mg  BID  Recent Labs: No results found for requested labs within last 8760 hours.  Recent Lipid Panel No results found for: CHOL, TRIG, HDL, CHOLHDL, VLDL, LDLCALC, LDLDIRECT   Risk Assessment/Calculations:    CHA2DS2-VASc Score = 0  This indicates a 0.2% annual risk of stroke. The patient's score is based upon: CHF History: 0 HTN History: 0 Diabetes History: 0 Stroke History: 0 Vascular Disease History: 0 Age Score: 0 Gender Score: 0      Physical Exam:    VS:  BP 118/70   Pulse 61   Ht 5\' 10"  (1.778 m)   Wt 185 lb (83.9 kg)   SpO2 97%   BMI 26.54 kg/m     Wt Readings from Last 3 Encounters:  08/25/20 185 lb (83.9 kg)  01/11/20 191 lb (86.6 kg)  11/20/18 181 lb (82.1 kg)     GEN:  Well nourished, well developed in no acute distress HEENT: Normal NECK: No JVD; No carotid bruits CARDIAC: RRR, no murmurs, rubs, gallops RESPIRATORY:  Clear to auscultation without rales, wheezing or rhonchi  ABDOMEN: Soft, non-tender, non-distended MUSCULOSKELETAL:  No edema; No deformity  SKIN: Warm and dry NEUROLOGIC:  Alert and oriented x 3 PSYCHIATRIC:  Normal affect   ASSESSMENT:    1. Paroxysmal atrial fibrillation (HCC)   2. Ascending aortic aneurysm (HCC)    PLAN:    In order of problems listed above:  #Paroxysmal Afib: CHADs-vasc 0-1 for mild atherosclerosis noted on CT chest. Not on AC. Maintained on flecainide. -Increase flecainide to 100mg  BID; stable QRS on this dose (128ms) since patient recently increased on his own -No AC for now given low CHADs-vasc; will age himself into needing AC -Continue ASA 81mg  daily -Will repeat ECG in 18months for monitoring  #Ascending aortic aneurysm: Measures 4.2cm. Stable over past several years. -Continue to monitor with  serial TTE/CT -Not hypertensive; will continue to monitor   Shared Decision Making/Informed Consent      Medication Adjustments/Labs and Tests Ordered: Current medicines are reviewed at length with the patient today.  Concerns regarding medicines are outlined above.  Orders Placed This Encounter  Procedures  . EKG 12-Lead   Meds ordered this encounter  Medications  . flecainide (TAMBOCOR) 50 MG tablet    Sig: Take 2 tablets (100 mg total) by mouth 2 (two) times daily.  Dispense:  180 tablet    Refill:  3    Patient Instructions  Medication Instructions:  Your physician has recommended you make the following change in your medication:   1-INCREASE Flecainide 100 mg by mouth twice daily.  *If you need a refill on your cardiac medications before your next appointment, please call your pharmacy*  Lab Work: If you have labs (blood work) drawn today and your tests are completely normal, you will receive your results only by: Marland Kitchen MyChart Message (if you have MyChart) OR . A paper copy in the mail If you have any lab test that is abnormal or we need to change your treatment, we will call you to review the results.  Follow-Up: At [facility], you and your health needs are our priority.  As part of our continuing mission to provide you with exceptional heart care, we have created designated Provider Care Teams.  These Care Teams include your primary Cardiologist (physician) and Advanced Practice Providers (APPs -  Physician Assistants and Nurse Practitioners) who all work together to provide you with the care you need, when you need it.  We recommend signing up for the patient portal called "MyChart".  Sign up information is provided on this After Visit Summary.  MyChart is used to connect with patients for Virtual Visits (Telemedicine).  Patients are able to view lab/test results, encounter notes, upcoming appointments, etc.  Non-urgent messages can be sent to your provider as well.    To learn more about what you can do with MyChart, go to [email or web address].    Your next appointment:   6 month(s)  The format for your next appointment:   In Person  Provider:   You may see Dr. Johney Frame or one of the following Advanced Practice Providers on your designated Care Team:    [name], PA-C  Robbie Lis, [place]        Signed, Freada Bergeron, MD  08/25/2020 10:28 AM    [place]

## 2020-08-25 ENCOUNTER — Other Ambulatory Visit: Payer: Self-pay

## 2020-08-25 ENCOUNTER — Encounter: Payer: Self-pay | Admitting: Cardiology

## 2020-08-25 ENCOUNTER — Ambulatory Visit: Payer: BC Managed Care – PPO | Admitting: Cardiology

## 2020-08-25 VITALS — BP 118/70 | HR 61 | Ht 70.0 in | Wt 185.0 lb

## 2020-08-25 DIAGNOSIS — I480 Paroxysmal atrial fibrillation: Secondary | ICD-10-CM | POA: Diagnosis not present

## 2020-08-25 DIAGNOSIS — I7121 Aneurysm of the ascending aorta, without rupture: Secondary | ICD-10-CM

## 2020-08-25 DIAGNOSIS — I712 Thoracic aortic aneurysm, without rupture: Secondary | ICD-10-CM

## 2020-08-25 MED ORDER — FLECAINIDE ACETATE 50 MG PO TABS: 100.0000 mg | ORAL_TABLET | Freq: Two times a day (BID) | ORAL | 3 refills | Status: DC

## 2020-08-25 NOTE — Patient Instructions (Signed)
Medication Instructions:  Your physician has recommended you make the following change in your medication:   1-INCREASE Flecainide 100 mg by mouth twice daily.  *If you need a refill on your cardiac medications before your next appointment, please call your pharmacy*  Lab Work: If you have labs (blood work) drawn today and your tests are completely normal, you will receive your results only by: Marland Kitchen MyChart Message (if you have MyChart) OR . A paper copy in the mail If you have any lab test that is abnormal or we need to change your treatment, we will call you to review the results.  Follow-Up: At [facility], you and your health needs are our priority.  As part of our continuing mission to provide you with exceptional heart care, we have created designated Provider Care Teams.  These Care Teams include your primary Cardiologist (physician) and Advanced Practice Providers (APPs -  Physician Assistants and Nurse Practitioners) who all work together to provide you with the care you need, when you need it.  We recommend signing up for the patient portal called "MyChart".  Sign up information is provided on this After Visit Summary.  MyChart is used to connect with patients for Virtual Visits (Telemedicine).  Patients are able to view lab/test results, encounter notes, upcoming appointments, etc.  Non-urgent messages can be sent to your provider as well.   To learn more about what you can do with MyChart, go to [email or web address].    Your next appointment:   6 month(s)  The format for your next appointment:   In Person  Provider:   You may see Dr. Johney Frame or one of the following Advanced Practice Providers on your designated Care Team:    [name], PA-C  [place], [place]

## 2021-04-04 ENCOUNTER — Other Ambulatory Visit: Payer: Self-pay

## 2021-04-04 MED ORDER — FLECAINIDE ACETATE 50 MG PO TABS: 100.0000 mg | ORAL_TABLET | Freq: Two times a day (BID) | ORAL | 1 refills | Status: DC

## 2021-06-06 ENCOUNTER — Telehealth: Payer: Self-pay | Admitting: *Deleted

## 2021-06-06 NOTE — Telephone Encounter (Signed)
   [place] [facility] Pre-operative Risk Assessment    Patient Name: Crewe Heathman  DOB: 1961-04-16 MRN: [id number]  HEARTCARE STAFF:  - IMPORTANT!!!!!! Under Visit Info/Reason for Call, type in Other and utilize the format Clearance MM/DD/YY or Clearance TBD. Do not use dashes or single digits. - Please review there is not already an duplicate clearance open for this procedure. - If request is for dental extraction, please clarify the # of teeth to be extracted. - If the patient is currently at the dentist's office, call Pre-Op Callback Staff (MA/nurse) to input urgent request.  - If the patient is not currently in the dentist office, please route to the Pre-Op pool.  Request for surgical clearance:  What type of surgery is being performed?  RT 2ND TOE AMPUTATION   When is this surgery scheduled?  TBD  What type of clearance is required (medical clearance vs. Pharmacy clearance to hold med vs. Both)?  BOTH  Are there any medications that need to be held prior to surgery and how long?  ASPIRIN  Practice name and name of physician performing surgery?  [facility] / DR. Doran Durand  What is the office phone number?  [phone]   7.   What is the office fax number?  [phone] ATTN:  KELLY HANCOCK  8.   Anesthesia type (None, local, MAC, general) ?  MAC   Jeanann Lewandowsky 06/06/2021, 4:17 PM  _________________________________________________________________   (provider comments below)

## 2021-06-06 NOTE — Telephone Encounter (Signed)
Called patient and wife. Unable to leave VM.

## 2021-06-12 NOTE — Telephone Encounter (Signed)
    Name: Dennis Chase  DOB: 06/18/1961  MRN: [id number]  Primary Cardiologist: Freada Bergeron, MD  Chart reviewed as part of pre-operative protocol coverage. Because of Jarmall Santmyer past medical history and time since last visit, he will require a follow-up visit in order to better assess preoperative cardiovascular risk. Gladstone Domin was last seen on 08/2020 by Dr. Johney Frame with pertinent CV hx to include atrial fib not on [facility], ascending aortic aneurysm 4.2cm 2017, spina bifida amongst other medical history per chart.  At last OV 08/2020 it was recommended he follow up in 6 months but do not see this occurred. Pt is on flecainide so will need EKG. No prior ischemic eval on file.  Pre-op covering staff: - Please schedule appointment and call patient to inform them.  - Please contact requesting surgeon's office via preferred method (i.e, phone, fax) to inform them of need for appointment prior to surgery.  Will hold off sending ASA request to MD as there is no acute contraindication to holding from cardiac standpoint in chart unless identified at [place].  Charlie Pitter, PA-C  06/12/2021, 9:39 AM

## 2021-06-12 NOTE — Telephone Encounter (Signed)
S/w pt and he has been scheduled to see [name], [facility] 07/04/21 for pre op clearance. Ok per [employer]. CMA to use 2:15 time slot. Pt aware of appt and thanked me for the call and the help. I will send notes to [facility] for appt. Will send FYI to surgeon's office pt has appt 07/04/21.

## 2021-06-21 NOTE — Telephone Encounter (Signed)
Follow Up:      Seth Bake is calling to check on the status of patient's clearance.

## 2021-06-21 NOTE — Telephone Encounter (Signed)
Pre-op covering staff, can you please notify surgeon's office of need for appointment on 07/04/2021. Pre-op risk assessment will be addressed at that time.  Thank you!

## 2021-06-21 NOTE — Telephone Encounter (Signed)
Returned call back to Dennis Chase and she has been made aware that pt will need to be seen before clearance can be given and he has been scheduled for 07/04/2021.

## 2021-07-04 ENCOUNTER — Other Ambulatory Visit: Payer: Self-pay

## 2021-07-04 ENCOUNTER — Encounter: Payer: Self-pay | Admitting: Physician Assistant

## 2021-07-04 ENCOUNTER — Ambulatory Visit: Payer: BC Managed Care – PPO | Admitting: Physician Assistant

## 2021-07-04 VITALS — BP 102/64 | HR 75 | Ht 71.0 in | Wt 173.4 lb

## 2021-07-04 DIAGNOSIS — Z01810 Encounter for preprocedural cardiovascular examination: Secondary | ICD-10-CM

## 2021-07-04 DIAGNOSIS — I712 Thoracic aortic aneurysm, without rupture: Secondary | ICD-10-CM

## 2021-07-04 DIAGNOSIS — I7121 Aneurysm of the ascending aorta, without rupture: Secondary | ICD-10-CM

## 2021-07-04 DIAGNOSIS — I480 Paroxysmal atrial fibrillation: Secondary | ICD-10-CM

## 2021-07-04 DIAGNOSIS — I77819 Aortic ectasia, unspecified site: Secondary | ICD-10-CM

## 2021-07-04 MED ORDER — METOPROLOL TARTRATE 25 MG PO TABS: 12.5000 mg | ORAL_TABLET | ORAL | 1 refills | Status: DC | PRN

## 2021-07-04 MED ORDER — METOPROLOL TARTRATE 25 MG PO TABS: 25.0000 mg | ORAL_TABLET | ORAL | 1 refills | Status: DC | PRN

## 2021-07-04 NOTE — Patient Instructions (Addendum)
Medication Instructions:  Your physician recommends that you continue on your current medications as directed. Please refer to the Current Medication list given to you today.  *If you need a refill on your cardiac medications before your next appointment, please call your pharmacy*   Lab Work: NONE If you have labs (blood work) drawn today and your tests are completely normal, you will receive your results only by: [place] (if you have MyChart) OR A paper copy in the mail If you have any lab test that is abnormal or we need to change your treatment, we will call you to review the results.   Testing/Procedures: Your physician has requested that you have an echocardiogram. Echocardiography is a painless test that uses sound waves to create images of your heart. It provides your doctor with information about the size and shape of your heart and how well your heart's chambers and valves are working. This procedure takes approximately one hour. There are no restrictions for this procedure.    Follow-Up: At [facility], you and your health needs are our priority.  As part of our continuing mission to provide you with exceptional heart care, we have created designated Provider Care Teams.  These Care Teams include your primary Cardiologist (physician) and Advanced Practice Providers (APPs -  Physician Assistants and Nurse Practitioners) who all work together to provide you with the care you need, when you need it.  We recommend signing up for the patient portal called "MyChart".  Sign up information is provided on this After Visit Summary.  MyChart is used to connect with patients for Virtual Visits (Telemedicine).  Patients are able to view lab/test results, encounter notes, upcoming appointments, etc.  Non-urgent messages can be sent to your provider as well.   To learn more about what you can do with MyChart, go to [email or web address].    Your next appointment:   6 month(s)  The  format for your next appointment:   In Person  Provider:   You may see Freada Bergeron, MD or one of the following Advanced Practice Providers on your designated Care Team:   [name], PA-C [place], [place]

## 2021-07-04 NOTE — Progress Notes (Signed)
Cardiology Office Note:    Date:  07/04/2021   ID:  Dennis Chase, DOB Jan 15, 1961, MRN [id number]  PCP:  Wenda Low, MD   [facility] Providers Cardiologist:  Freada Bergeron, MD      Referring MD: Wenda Low, MD   Chief Complaint:  Surgical Clearance    Patient Profile:    Dennis Chase is a 60 y.o. male with:  Paroxysmal atrial fibrillation Not on anticoagulation secondary to low stroke risk Flecainide Rx Ascending thoracic aortic aneurysm CT 2017: 4.2 cm   Prior CV studies:  ECHOCARDIOGRAM 11/26/2018  1. The left ventricle has normal systolic function of 48-88%. The cavity  size was normal. There is no increased left ventricular wall thickness.  Echo evidence of normal diastolic relaxation.   2. The right ventricle has normal systolic function. The cavity was  normal. There is no increase in right ventricular wall thickness. Right  ventricular systolic pressure normal with an estimated pressure of 20.3  mmHg.   3. The mitral valve is normal in structure.   4. The tricuspid valve is normal in structure.   5. The aortic valve is tricuspid.   6. The pulmonic valve was normal in structure.   7. There is mild dilatation of the ascending aorta.   CHEST CT 09/23/16 IMPRESSION: 1. There is mild aneurysmal dilatation of the ascending thoracic aorta at the level of the sinuses of Valsalva measuring 4.2 mm. Recommend annual imaging followup by CTA or MRA. This recommendation follows 2010 ACCF/AHA/AATS/ACR/ASA/SCA/SCAI/SIR/STS/SVM Guidelines for the Diagnosis and Management of Patients with Thoracic Aortic Disease. Circulation. 2010; 121: B169-I503 2. Previously noted small pulmonary nodules are stable from previous exam likely representing a benign abnormality. 3. New 5 mm right lower lobe lung nodule is identified. No follow-up needed if patient is low-risk. Non-contrast chest CT can be considered in 12 months if patient is high-risk. This  recommendation follows the consensus statement: Guidelines for Management of Incidental Pulmonary Nodules Detected on CT Images: From the Fleischner Society 2017; Radiology 2017; 284:228-243.    History of Present Illness: Dennis Chase was last seen by Dr. Johney Frame in 11/21.  The pt had experienced more symptoms of atrial fibrillation and increased his Flecainide to 100 mg twice daily with improved symptoms.  This dose was continued. Of note, he exercises on a regular basis (rides a bike 50-60 mile a week). He returns for surgical clearance.  He needs a R 2nd toe amputation with Dr. Doran Durand under MAC anesthesia.  He is here alone.  He continues to ride a bike between 40 and 60 miles a week.  He has no issues with chest pain or significant shortness of breath.  He has no orthopnea or leg edema.  He has not had any episodes of syncope.        Past Medical History:  Diagnosis Date   Adenocarcinoma of duodenum (HCC)    Complication of anesthesia    Paroxysmal atrial fibrillation (HCC)    PONV (postoperative nausea and vomiting)    Spina bifida of lumbar region [facility])     Current Medications: Current Meds  Medication Sig   aspirin EC 81 MG tablet Take 81 mg by mouth daily.   flecainide (TAMBOCOR) 50 MG tablet Take 2 tablets (100 mg total) by mouth 2 (two) times daily.   gabapentin (NEURONTIN) 100 MG capsule Take 100 mg by mouth as needed (for pain).   ibuprofen (ADVIL,MOTRIN) 200 MG tablet Take 200 mg by mouth at bedtime as needed. For  pain   zolpidem (AMBIEN) 10 MG tablet Take 5 mg by mouth at bedtime as needed for sleep.    [DISCONTINUED] metoprolol tartrate (LOPRESSOR) 25 MG tablet Take 1 tablet (25 mg total) by mouth as needed (Palpitations).     Allergies:   Latex   Social History   Tobacco Use   Smoking status: Never   Smokeless tobacco: Never  Vaping Use   Vaping Use: Never used  Substance Use Topics   Alcohol use: Yes    Alcohol/week: 0.0 standard drinks    Comment: socially  1-2 times a week   Drug use: No     Family Hx: The patient's family history includes Atrial fibrillation in his brother, brother, and brother; Bradycardia in his father; Emphysema in his mother; Stroke in his father.  ROS see HPI  EKGs/Labs/Other Test Reviewed:    EKG:  EKG is   ordered today.  The ekg ordered today demonstrates NSR, HR 75, normal axis, first-degree AV block, PR 204, QRS 114, QTc 442  Recent Labs: No results found for requested labs within last 8760 hours.   Recent Lipid Panel No results found for: CHOL, TRIG, HDL, LDLCALC, LDLDIRECT    Risk Assessment/Calculations:    CHA2DS2-VASc Score = 0   This indicates a 0.2% annual risk of stroke. The patient's score is based upon: CHF History: 0 HTN History: 0 Diabetes History: 0 Stroke History: 0 Vascular Disease History: 0 Age Score: 0 Gender Score: 0          Physical Exam:    VS:  BP 102/64   Pulse 75   Ht _0  (1.803 m)   Wt 173 lb 6.4 oz (78.7 kg)   SpO2 95%   BMI 24.18 kg/m     Wt Readings from Last 3 Encounters:  07/04/21 173 lb 6.4 oz (78.7 kg)  08/25/20 185 lb (83.9 kg)  01/11/20 191 lb (86.6 kg)     Constitutional:      Appearance: Healthy appearance. Not in distress.  Neck:     Vascular: JVD normal.  Pulmonary:     Effort: Pulmonary effort is normal.     Breath sounds: No wheezing. No rales.  Cardiovascular:     Normal rate. Regular rhythm. Normal S1. Normal S2.      Murmurs: There is no murmur.  Edema:    Peripheral edema absent.  Abdominal:     Palpations: Abdomen is soft.  Skin:    General: Skin is warm and dry.  Neurological:     General: No focal deficit present.     Mental Status: Alert and oriented to person, place and time.     Cranial Nerves: Cranial nerves are intact.        ASSESSMENT & PLAN:    1. Preoperative cardiovascular examination   Dennis Chase perioperative risk of a major cardiac event is 0.4% according to the Revised Cardiac Risk Index (RCRI).   Therefore, he is at low risk for perioperative complications.   His functional capacity is excellent at 5.32 METs according to the Duke Activity Status Index (DASI). Recommendations: According to ACC/AHA guidelines, no further cardiovascular testing needed.  The patient may proceed to surgery at acceptable risk.   Antiplatelet and/or Anticoagulation Recommendations: Aspirin can be held for 7 days prior to his surgery.  Please resume Aspirin post operatively when it is felt to be safe from a bleeding standpoint.   2. Paroxysmal atrial fibrillation (HCC) Maintaining normal sinus rhythm.  QRS stable.  Continue flecainide 100 mg twice daily. He could not tolerate beta-blocker due to hypotension and ED.  He has metoprolol at home to take prn.  I will renew his metoprolol for him as it is > 61 year old.   3. Ascending aortic aneurysm (HCC) 4.2 cm on prior CT.  Arrange f/u echocardiogram in 6 mos prior to next visit.          Dispo:  Return in about 6 months (around 01/01/2022) for Routine Follow Up, w/ Dr. Johney Frame.   Medication Adjustments/Labs and Tests Ordered: Current medicines are reviewed at length with the patient today.  Concerns regarding medicines are outlined above.  Tests Ordered: Orders Placed This Encounter  Procedures   EKG 12-Lead   ECHOCARDIOGRAM COMPLETE   Medication Changes: Meds ordered this encounter  Medications   DISCONTD: metoprolol tartrate (LOPRESSOR) 25 MG tablet    Sig: Take 1 tablet (25 mg total) by mouth as needed (Palpitations).    Dispense:  10 tablet    Refill:  1   metoprolol tartrate (LOPRESSOR) 25 MG tablet    Sig: Take 0.5-1 tablets (12.5-25 mg total) by mouth as needed (Palpitations).    Dispense:  10 tablet    Refill:  1    Signed, [name], PA-C  07/04/2021 3:04 PM    [facility] [address], [place], [place]  [address] Phone: [phone]; Fax: [phone]

## 2021-07-05 NOTE — Telephone Encounter (Signed)
Notes faxed to surgeon's office.  [name], PA-C    07/05/2021 9:24 AM

## 2021-07-11 ENCOUNTER — Other Ambulatory Visit: Payer: Self-pay

## 2021-07-11 MED ORDER — FLECAINIDE ACETATE 50 MG PO TABS: 100.0000 mg | ORAL_TABLET | Freq: Two times a day (BID) | ORAL | 3 refills | Status: DC

## 2021-07-12 ENCOUNTER — Other Ambulatory Visit: Payer: Self-pay

## 2021-07-12 MED ORDER — METOPROLOL TARTRATE 25 MG PO TABS: 12.5000 mg | ORAL_TABLET | ORAL | 1 refills | Status: DC | PRN

## 2021-10-29 ENCOUNTER — Other Ambulatory Visit: Payer: Self-pay

## 2021-10-29 MED ORDER — FLECAINIDE ACETATE 50 MG PO TABS: 100.0000 mg | ORAL_TABLET | Freq: Two times a day (BID) | ORAL | 2 refills | Status: DC

## 2022-01-02 ENCOUNTER — Ambulatory Visit (HOSPITAL_COMMUNITY): Payer: BC Managed Care – PPO | Attending: Cardiovascular Disease

## 2022-01-02 ENCOUNTER — Other Ambulatory Visit: Payer: Self-pay

## 2022-01-02 DIAGNOSIS — I77819 Aortic ectasia, unspecified site: Secondary | ICD-10-CM | POA: Diagnosis not present

## 2022-01-02 DIAGNOSIS — I7121 Aneurysm of the ascending aorta, without rupture: Secondary | ICD-10-CM | POA: Diagnosis not present

## 2022-01-02 DIAGNOSIS — I480 Paroxysmal atrial fibrillation: Secondary | ICD-10-CM | POA: Diagnosis not present

## 2022-01-02 LAB — ECHOCARDIOGRAM COMPLETE
Area-P 1/2: 3.42 cm2
S' Lateral: 3 cm

## 2022-01-03 ENCOUNTER — Encounter: Payer: Self-pay | Admitting: Physician Assistant

## 2022-01-03 ENCOUNTER — Telehealth: Payer: Self-pay

## 2022-01-03 NOTE — Telephone Encounter (Signed)
Patient reviewed results in [place]. Order for CT has been placed.  ?

## 2022-01-03 NOTE — Telephone Encounter (Signed)
-----   Message from Liliane Shi, PA-C sent at 01/03/2022  8:21 AM EDT ----- ?EF normal.  Ascending aorta is dilated at 46 mm.   ?PLAN:  ?-Arrange chest/aorta CTA to size thoracic aortic aneurysm. ?-Send copy to PCP ?[name], PA-C    ?01/03/2022 8:17 AM   ? ?

## 2022-01-06 NOTE — Progress Notes (Deleted)
?Cardiology Office Note:   ? ?Date:  01/06/2022  ? ?ID:  Dennis Chase, DOB 07-18-1961, MRN [id number] ? ?PCP:  Dennis Low, MD  ?[facility] Cardiologist:  Dennis Bergeron, MD  ?[facility] Electrophysiologist:  None  ? ?Referring MD: Dennis Low, MD  ? ? ? ?History of Present Illness:   ? ?Dennis Chase is a 61 y.o. male with a hx of pAfib not on AC and ascending aortic aneurysm who presents to clinic for follow-up of his Afib. ? ?The patient was last seen by Dr. Agustin Chase in 12/2019. He has been maintained on flecainide for his Afib and has been doing well. Not on AC due to CHADs-vasc score of 0. ? ?Initially seen by me in 08/2020 where he was having episodes of Afib and his flec was increased to '100mg'$  BID.  ? ?Was last seen by Dennis Chase on 06/2021 where he was doing well. Remained very active.  ? ? ?Past Medical History:  ?Diagnosis Date  ? Adenocarcinoma of duodenum ([place])   ? Complication of anesthesia   ? Paroxysmal atrial fibrillation (HCC)   ? PONV (postoperative nausea and vomiting)   ? Spina bifida of lumbar region [facility])   ? Thoracic aortic aneurysm 11/20/2018  ? Echocardiogram 3/23: EF 60-65, no RWMA, mild LVH, GLS -22.9, normal RVSF, normal PASP, mild LAE, aortic root 43 mm, ascending aorta 46 mm  ? ? ?Past Surgical History:  ?Procedure Laterality Date  ? EXPLORATORY LAPAROTOMY W/ BOWEL RESECTION    ? resection of duodenum  ? FOOT SURGERY    ? multiple for spinal bifida  ? SPINAL FIXATION SURGERY    ? VENTRAL HERNIA REPAIR    ? ? ?Current Medications: ?No outpatient medications have been marked as taking for the 01/09/22 encounter (Appointment) with Dennis Bergeron, MD.  ?  ? ?Allergies:   Latex  ? ?Social History  ? ?Socioeconomic History  ? Marital status: Married  ?  Spouse name: Not on file  ? Number of children: Not on file  ? Years of education: Not on file  ? Highest education level: Not on file  ?Occupational History  ? Not on file  ?Tobacco Use  ? Smoking status: Never  ?  Smokeless tobacco: Never  ?Vaping Use  ? Vaping Use: Never used  ?Substance and Sexual Activity  ? Alcohol use: Yes  ?  Alcohol/week: 0.0 standard drinks  ?  Comment: socially 1-2 times a week  ? Drug use: No  ? Sexual activity: Yes  ?Other Topics Concern  ? Not on file  ?Social History Narrative  ? 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Dennis Chase  ? ?Social Determinants of Health  ? ?Financial Resource Strain: Not on file  ?Food Insecurity: Not on file  ?Transportation Needs: Not on file  ?Physical Activity: Not on file  ?Stress: Not on file  ?Social Connections: Not on file  ?  ? ?Family History: ?The patient's family history includes Atrial fibrillation in his brother, brother, and brother; Bradycardia in his father; Emphysema in his mother; Stroke in his father. ? ?ROS:   ?Please see the history of present illness.    ?Review of Systems  ?Constitutional:  Negative for chills, diaphoresis and fever.  ?HENT:  Negative for hearing loss.   ?Eyes:  Negative for blurred vision.  ?Respiratory:  Negative for cough.   ?Cardiovascular:  Negative for chest pain, palpitations, orthopnea, claudication, leg swelling and PND.  ?Gastrointestinal:  Negative for blood in stool.  ?Genitourinary:  Negative for hematuria.  ?Musculoskeletal:  Negative for myalgias.  ?Neurological:  Negative for dizziness and loss of consciousness.  ?Psychiatric/Behavioral:  Negative for substance abuse.   ? ?EKGs/Labs/Other Studies Reviewed:   ? ?The following studies were reviewed today: ?TTE 12/2021: ?IMPRESSIONS  ? ? ? 1. Left ventricular ejection fraction, by estimation, is 60 to 65%. Left  ?ventricular ejection fraction by PLAX is 62 %. The left ventricle has  ?normal function. The left ventricle has no regional wall motion  ?abnormalities. There is mild left ventricular  ?hypertrophy of the basal-septal segment. Left ventricular diastolic  ?parameters were normal. The average left ventricular global longitudinal  ?strain is -22.9 %. The global  longitudinal strain is normal.  ? 2. Right ventricular systolic function is normal. The right ventricular  ?size is normal. There is normal pulmonary artery systolic pressure.  ? 3. Left atrial size was mildly dilated.  ? 4. The mitral valve is normal in structure. No evidence of mitral valve  ?regurgitation. No evidence of mitral stenosis.  ? 5. The aortic valve is tricuspid. Aortic valve regurgitation is not  ?visualized. No aortic stenosis is present.  ? 6. Aortic dilatation noted. There is mild dilatation of the aortic root,  ?measuring 43 mm. There is moderate dilatation of the ascending aorta,  ?measuring 46 mm.  ? 7. The inferior vena cava is normal in size with <50% respiratory  ?variability, suggesting right atrial pressure of 8 mmHg.  ? ?CT Chest 09/23/16: ?FINDINGS: ?Cardiovascular: Normal heart size. Mild aortic atherosclerosis. No ?pericardial effusion. At the level of the sinuses of Valsalva the ?ascending aorta measures 4.2 mm, image 59 of series 3. At the ?sinotubular junction this measures 3.4 cm and a few cm above the ?sino-tubular junction this measures 3.6 cm. Not significantly ?changed from previous exam. Normal caliber of the great vessels. ?  ?Mediastinum/Nodes: No enlarged mediastinal or axillary lymph nodes. ?Thyroid gland, trachea, and esophagus demonstrate no significant ?findings. ?  ?Lungs/Pleura: Lungs are clear. No pleural effusion or pneumothorax.3 ?mm pulmonary nodule in the right upper lobe is unchanged from the ?previous exam, image 60 series 5. 5 mm left lower lobe lung nodule ?is stable from previous exam, image 119 of series 5. Calcified ?granuloma identified within the lingula. 5 mm right lower lobe lung ?nodule go is new from previous exam, image 98 of series 5. ?  ?Upper Abdomen: No acute abnormality identified. 3.1 cm cyst in the ?left lobe of liver is unchanged. ?  ?Musculoskeletal: No chest wall mass or suspicious bone lesions ?identified. ?  ?IMPRESSION: ?1. There is mild  aneurysmal dilatation of the ascending thoracic ?aorta at the level of the sinuses of Valsalva measuring 4.2 mm. ?Recommend annual imaging followup by CTA or MRA. This recommendation ?follows 2010 ACCF/AHA/AATS/ACR/ASA/SCA/SCAI/SIR/STS/SVM Guidelines ?for the Diagnosis and Management of Patients with Thoracic Aortic ?Disease. Circulation. 2010; 121: P710-G269 ?2. Previously noted small pulmonary nodules are stable from previous ?exam likely representing a benign abnormality. ?3. New 5 mm right lower lobe lung nodule is identified. No follow-up needed if patient is Chase-risk. Non-contrast chest CT can be ?considered in 12 months if patient is high-risk. This recommendation ?follows the consensus statement: Guidelines for Management of ?Incidental Pulmonary Nodules Detected on CT Images: From the ?Fleischner Society 2017; Radiology 2017; [phone]. ? ?TTE 11/2018: ? 1. The left ventricle has normal systolic function of 50-09%. The cavity  ?size was normal. There is no increased left ventricular wall thickness.  ?Echo evidence of normal diastolic relaxation.  ?  2. The right ventricle has normal systolic function. The cavity was  ?normal. There is no increase in right ventricular wall thickness. Right  ?ventricular systolic pressure normal with an estimated pressure of 20.3  ?mmHg.  ? 3. The mitral valve is normal in structure.  ? 4. The tricuspid valve is normal in structure.  ? 5. The aortic valve is tricuspid.  ? 6. The pulmonic valve was normal in structure.  ? 7. There is mild dilatation of the ascending aorta. ? ?EKG:  EKG is  ordered today.  The ekg ordered today demonstrates NSR with HR 61, QRS 134m/PR 1947mQTc400ms on flecainide '100mg'$  BID ? ?Recent Labs: ?No results found for requested labs within last 8760 hours.  ?Recent Lipid Panel ?No results found for: CHOL, TRIG, HDL, CHOLHDL, VLDL, LDLCALC, LDLDIRECT ? ? ?Risk Assessment/Calculations:   ? ?CHA2DS2-VASc Score =    ?This indicates a  % annual risk of  stroke. ?The patient's score is based upon: ?  ?  ?  ? ?Physical Exam:   ? ?VS:  There were no vitals taken for this visit.   ? ?Wt Readings from Last 3 Encounters:  ?07/04/21 173 lb 6.4 oz (78.7 kg)  ?11/0

## 2022-01-08 ENCOUNTER — Encounter: Payer: Self-pay | Admitting: Cardiology

## 2022-01-08 NOTE — Progress Notes (Signed)
?Cardiology Office Note:   ? ?Date:  01/09/2022  ? ?ID:  Dennis Chase, DOB [birth year], MRN [id number] ? ?PCP:  Dennis Low, MD  ?[facility] Cardiologist:  Dennis Bergeron, MD  ?[facility] Electrophysiologist:  None  ? ?Referring MD: Dennis Low, MD  ? ?History of Present Illness:   ? ?Dennis Chase is a 61 y.o. male with a hx of pAfib not on AC and ascending aortic aneurysm who presents to clinic for follow-up of his Afib. ? ?The patient was last seen by Dr. Agustin Chase in 12/2019. He has been maintained on flecainide for his Afib and has been doing well. Not on AC due to CHADs-vasc score of 0. ?  ?Initially seen by me in 08/2020 where he was having episodes of Afib and his flec was increased to '100mg'$  BID.  ?  ?Was last seen by Richardson Chase on 06/2021 where he was doing well. Remained very active.  ? ?Today, he is doing well. He stays active by biking 40 to 50 miles weekly. Since his last visit, he has not had recurrent episodes of Afib. He is compliant with his medications. He has not needed to take his metoprolol. He denies chest pain, chest pressure, dyspnea at rest or with exertion, PND, orthopnea, or leg swelling. ? ?If he stands up quickly, he will become lightheaded. He drinks 2 to 3 L of water daily. He denies LOC or syncope.  ? ?His father also has a dilated aortic root to about 43 mm.  ? ?Past Medical History:  ?Diagnosis Date  ? Adenocarcinoma of duodenum (Dennis Chase)   ? Complication of anesthesia   ? Paroxysmal atrial fibrillation (HCC)   ? PONV (postoperative nausea and vomiting)   ? Spina bifida of lumbar region [facility])   ? Thoracic aortic aneurysm 11/20/2018  ? Echocardiogram 3/23: EF 60-65, no RWMA, mild LVH, GLS -22.9, normal RVSF, normal PASP, mild LAE, aortic root 43 mm, ascending aorta 46 mm  ? ? ?Past Surgical History:  ?Procedure Laterality Date  ? EXPLORATORY LAPAROTOMY W/ BOWEL RESECTION    ? resection of duodenum  ? FOOT SURGERY    ? multiple for spinal bifida  ? SPINAL FIXATION SURGERY     ? VENTRAL HERNIA REPAIR    ? ? ?Current Medications: ?Current Meds  ?Medication Sig  ? aspirin EC 81 MG tablet Take 81 mg by mouth daily.  ? flecainide (TAMBOCOR) 50 MG tablet Take 2 tablets (100 mg total) by mouth 2 (two) times daily.  ? gabapentin (NEURONTIN) 100 MG capsule Take 100 mg by mouth as needed (for pain).  ? ibuprofen (ADVIL,MOTRIN) 200 MG tablet Take 200 mg by mouth at bedtime as needed. For pain  ? metoprolol tartrate (LOPRESSOR) 25 MG tablet Take 0.5-1 tablets (12.5-25 mg total) by mouth as needed (Palpitations).  ? zolpidem (AMBIEN) 10 MG tablet Take 5 mg by mouth at bedtime as needed for sleep.   ?  ? ?Allergies:   Latex  ? ?Social History  ? ?Socioeconomic History  ? Marital status: Married  ?  Spouse name: Not on file  ? Number of children: Not on file  ? Years of education: Not on file  ? Highest education level: Not on file  ?Occupational History  ? Not on file  ?Tobacco Use  ? Smoking status: Never  ? Smokeless tobacco: Never  ?Vaping Use  ? Vaping Use: Never used  ?Substance and Sexual Activity  ? Alcohol use: Yes  ?  Alcohol/week: 0.0 standard drinks  ?  Comment: socially 1-2 times a week  ? Drug use: No  ? Sexual activity: Yes  ?Other Topics Concern  ? Not on file  ?Social History Narrative  ? 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[place]  ? ?Social Determinants of Health  ? ?Financial Resource Strain: Not on file  ?Food Insecurity: Not on file  ?Transportation Needs: Not on file  ?Physical Activity: Not on file  ?Stress: Not on file  ?Social Connections: Not on file  ?  ? ?Family History: ?The patient's family history includes Atrial fibrillation in his brother, brother, brother, father, and mother; Bradycardia in his father; Emphysema in his mother; Stroke in his father. ? ?ROS:   ?Please see the history of present illness.    ?Review of Systems  ?Constitutional:  Negative for chills, diaphoresis and fever.  ?HENT:  Negative for hearing loss.   ?Eyes:  Negative for blurred vision.   ?Respiratory:  Negative for cough.   ?Cardiovascular:  Negative for chest pain, palpitations, orthopnea, claudication, leg swelling and PND.  ?Gastrointestinal:  Negative for blood in stool.  ?Genitourinary:  Negative for hematuria.  ?Musculoskeletal:  Negative for myalgias.  ?Neurological:  Positive for dizziness (Positional). Negative for loss of consciousness.  ?Psychiatric/Behavioral:  Negative for substance abuse.   ? ?EKGs/Labs/Other Studies Reviewed:   ? ?The following studies were reviewed today: ?TTE 12/2021: ?IMPRESSIONS  ? 1. Left ventricular ejection fraction, by estimation, is 60 to 65%. Left ventricular ejection fraction by PLAX is 62 %. The left ventricle has normal function. The left ventricle has no regional wall motion abnormalities. There is mild left ventricular hypertrophy of the basal-septal segment. Left ventricular diastolic parameters were normal. The average left ventricular global longitudinal strain is -22.9 %. The global longitudinal strain is normal.  ? 2. Right ventricular systolic function is normal. The right ventricular size is normal. There is normal pulmonary artery systolic pressure.  ? 3. Left atrial size was mildly dilated.  ? 4. The mitral valve is normal in structure. No evidence of mitral valve regurgitation. No evidence of mitral stenosis.  ? 5. The aortic valve is tricuspid. Aortic valve regurgitation is not visualized. No aortic stenosis is present.  ? 6. Aortic dilatation noted. There is mild dilatation of the aortic root, measuring 43 mm. There is moderate dilatation of the ascending aorta, measuring 46 mm.  ? 7. The inferior vena cava is normal in size with <50% respiratory variability, suggesting right atrial pressure of 8 mmHg.  ? ?CT Chest 09/23/16: ?FINDINGS: ?Cardiovascular: Normal heart size. Mild aortic atherosclerosis. No pericardial effusion. At the level of the sinuses of Valsalva the ascending aorta measures 4.2 mm, image 59 of series 3. At the sinotubular  junction this measures 3.4 cm and a few cm above the sino-tubular junction this measures 3.6 cm. Not significantly changed from previous exam. Normal caliber of the great vessels. ?  ?Mediastinum/Nodes: No enlarged mediastinal or axillary lymph nodes. Thyroid gland, trachea, and esophagus demonstrate no significant findings. ?  ?Lungs/Pleura: Lungs are clear. No pleural effusion or pneumothorax.3 mm pulmonary nodule in the right upper lobe is unchanged from the previous exam, image 60 series 5. 5 mm left lower lobe lung nodule is stable from previous exam, image 119 of series 5. Calcified granuloma identified within the lingula. 5 mm right lower lobe lung ?nodule go is new from previous exam, image 98 of series 5. ?  ?Upper Abdomen: No acute abnormality identified. 3.1 cm cyst in the left lobe of liver is unchanged. ?  ?  Musculoskeletal: No chest wall mass or suspicious bone lesions identified. ?  ?IMPRESSION: ?1. There is mild aneurysmal dilatation of the ascending thoracic aorta at the level of the sinuses of Valsalva measuring 4.2 mm. Recommend annual imaging followup by CTA or MRA. This recommendation follows 2010 ACCF/AHA/AATS/ACR/ASA/SCA/SCAI/SIR/STS/SVM Guidelines for the Diagnosis and Management of Patients with Thoracic Aortic Disease. Circulation. 2010; 121: Q034-V425 ?2. Previously noted small pulmonary nodules are stable from previous ?exam likely representing a benign abnormality. ?3. New 5 mm right lower lobe lung nodule is identified. No follow-up needed if patient is Chase-risk. Non-contrast chest CT can be ?considered in 12 months if patient is high-risk. This recommendation ?follows the consensus statement: Guidelines for Management of ?Incidental Pulmonary Nodules Detected on CT Images: From the ?Fleischner Society 2017; Radiology 2017; [phone]. ? ?TTE 11/2018: ? 1. The left ventricle has normal systolic function of 33-29%. The cavity  ?size was normal. There is no increased left ventricular wall  thickness.  ?Echo evidence of normal diastolic relaxation.  ? 2. The right ventricle has normal systolic function. The cavity was  ?normal. There is no increase in right ventricular wall thickness. Ri

## 2022-01-09 ENCOUNTER — Ambulatory Visit: Payer: BC Managed Care – PPO | Admitting: Cardiology

## 2022-01-09 ENCOUNTER — Encounter: Payer: Self-pay | Admitting: Cardiology

## 2022-01-09 ENCOUNTER — Other Ambulatory Visit: Payer: Self-pay

## 2022-01-09 VITALS — BP 122/72 | HR 67 | Ht 71.0 in | Wt 182.0 lb

## 2022-01-09 DIAGNOSIS — I700 Atherosclerosis of aorta: Secondary | ICD-10-CM

## 2022-01-09 DIAGNOSIS — I7121 Aneurysm of the ascending aorta, without rupture: Secondary | ICD-10-CM

## 2022-01-09 DIAGNOSIS — I480 Paroxysmal atrial fibrillation: Secondary | ICD-10-CM | POA: Diagnosis not present

## 2022-01-09 LAB — BASIC METABOLIC PANEL
BUN/Creatinine Ratio: 18 (ref 10–24)
BUN: 19 mg/dL (ref 8–27)
CO2: 26 mmol/L (ref 20–29)
Calcium: 9.5 mg/dL (ref 8.6–10.2)
Chloride: 103 mmol/L (ref 96–106)
Creatinine, Ser: 1.03 mg/dL (ref 0.76–1.27)
Glucose: 104 mg/dL — ABNORMAL HIGH (ref 70–99)
Potassium: 4.6 mmol/L (ref 3.5–5.2)
Sodium: 140 mmol/L (ref 134–144)
eGFR: 82 mL/min/{1.73_m2} (ref >59)

## 2022-01-09 NOTE — Patient Instructions (Signed)
Medication Instructions:  ? ?Your physician recommends that you continue on your current medications as directed. Please refer to the Current Medication list given to you today. ? ?*If you need a refill on your cardiac medications before your next appointment, please call your pharmacy* ? ? ?Lab Work: ? ?NEED YOUR BMET SCOTT WEAVER PA-C SCHEDULED FOR YOU TO HAVE DONE PRIOR TO YOUR CT ANGIO CHEST AORTA ? ?If you have labs (blood work) drawn today and your tests are completely normal, you will receive your results only by: ?MyChart Message (if you have MyChart) OR ?A paper copy in the mail ?If you have any lab test that is abnormal or we need to change your treatment, we will call you to review the results. ? ? ?Testing/Procedures: ? ?SCHEDULE CT ANGIO CHEST AORTA THAT SCOTT WEAVER PA-C ORDERED ON 01/03/22 ? ? ?Follow-Up: ?At [facility], you and your health needs are our priority.  As part of our continuing mission to provide you with exceptional heart care, we have created designated Provider Care Teams.  These Care Teams include your primary Cardiologist (physician) and Advanced Practice Providers (APPs -  Physician Assistants and Nurse Practitioners) who all work together to provide you with the care you need, when you need it. ? ?We recommend signing up for the patient portal called "MyChart".  Sign up information is provided on this After Visit Summary.  MyChart is used to connect with patients for Virtual Visits (Telemedicine).  Patients are able to view lab/test results, encounter notes, upcoming appointments, etc.  Non-urgent messages can be sent to your provider as well.   ?To learn more about what you can do with MyChart, go to [email or web address].   ? ?Your next appointment:   ?1 year(s) ? ?The format for your next appointment:   ?In Person ? ?Provider:   ?Freada Bergeron, MD { ? ? ? ?

## 2022-01-16 ENCOUNTER — Encounter: Payer: Self-pay | Admitting: Cardiology

## 2022-01-16 NOTE — Telephone Encounter (Signed)
The ascending aorta was 46 mm on echocardiogram which is moderately enlarged. It was 42 mm in 2020.  For smaller dilations, I think it is ok to follow with yearly echocardiograms.  However, when the size gets to 45 mm or greater, I usually start getting CT scans. The CT with contrast will give a more accurate size than the echocardiogram.  If it is truly > 45 mm on CT, I would also have him establish with a thoracic surgeon to follow it going forward. It is not unreasonable to wait 6 mos after his echocardiogram to get the CT if cost is burdensome.  However, I recommend getting it done at least in the next 6 months. ?[name], PA-C    ?01/16/2022 5:22 PM   ?

## 2022-02-01 ENCOUNTER — Ambulatory Visit (INDEPENDENT_AMBULATORY_CARE_PROVIDER_SITE_OTHER)
Admission: RE | Admit: 2022-02-01 | Discharge: 2022-02-01 | Disposition: A | Discharge status: Home / Self Care | Payer: BC Managed Care – PPO | Source: Ambulatory Visit | Attending: Physician Assistant | Admitting: Physician Assistant

## 2022-02-01 ENCOUNTER — Telehealth: Payer: Self-pay

## 2022-02-01 ENCOUNTER — Encounter: Payer: Self-pay | Admitting: Physician Assistant

## 2022-02-01 DIAGNOSIS — I7121 Aneurysm of the ascending aorta, without rupture: Secondary | ICD-10-CM

## 2022-02-01 DIAGNOSIS — R918 Other nonspecific abnormal finding of lung field: Secondary | ICD-10-CM

## 2022-02-01 HISTORY — DX: Other nonspecific abnormal finding of lung field: R91.8

## 2022-02-01 MED ORDER — IOHEXOL 350 MG/ML SOLN
100.0000 mL | Freq: Once | INTRAVENOUS | Status: AC | PRN
  Administered 2022-02-01: 100 mL via INTRAVENOUS

## 2022-02-01 NOTE — Telephone Encounter (Signed)
Left message for patient that results were available in MyChart with [facility] comments. Advised to call back with any questions. Orders placed for repeat CTA in one year. Referral placed for pulmonology.  ?

## 2022-02-01 NOTE — Telephone Encounter (Signed)
-----   Message from Liliane Shi, [place] sent at 02/01/2022  1:36 PM EDT ----- ?CT shows stable size of aorta.  The size of the aortic root is 44 mm on this study.  The ascending aorta actually looks normal size on this study (it was enlarged on the echocardiogram). ?There is an area in the right lung that may be scarring but will need follow up to make sure. ?PLAN:  ?-For the mildly dilated aortic root, arrange a f/u Chest/Aorta CTA in 1 year. ?-For the ground glass opacity in the RLL, please refer to Pulmonology (Dx: lung nodule - ask for the lung nodule clinic). ?-Send copy to PCP. ?[name], PA-C    ?02/01/2022 1:22 PM   ? ?

## 2022-12-03 ENCOUNTER — Other Ambulatory Visit: Payer: Self-pay

## 2022-12-03 MED ORDER — FLECAINIDE ACETATE 50 MG PO TABS: 100.0000 mg | ORAL_TABLET | Freq: Two times a day (BID) | ORAL | 0 refills | Status: DC

## 2023-01-31 ENCOUNTER — Ambulatory Visit (HOSPITAL_BASED_OUTPATIENT_CLINIC_OR_DEPARTMENT_OTHER): Payer: BC Managed Care – PPO

## 2023-01-31 ENCOUNTER — Inpatient Hospital Stay: Admission: RE | Admit: 2023-01-31 | Payer: BC Managed Care – PPO | Source: Ambulatory Visit

## 2023-07-01 ENCOUNTER — Other Ambulatory Visit: Payer: Self-pay | Admitting: Urology

## 2023-07-01 DIAGNOSIS — R972 Elevated prostate specific antigen [PSA]: Secondary | ICD-10-CM

## 2023-08-27 ENCOUNTER — Ambulatory Visit
Admission: RE | Admit: 2023-08-27 | Discharge: 2023-08-27 | Disposition: A | Discharge status: Home / Self Care | Payer: BC Managed Care – PPO | Source: Ambulatory Visit | Attending: Urology

## 2023-08-27 DIAGNOSIS — R972 Elevated prostate specific antigen [PSA]: Secondary | ICD-10-CM

## 2023-08-27 MED ORDER — GADOPICLENOL 0.5 MMOL/ML IV SOLN
8.0000 mL | Freq: Once | INTRAVENOUS | Status: AC | PRN
  Administered 2023-08-27: 8 mL via INTRAVENOUS

## 2023-09-17 ENCOUNTER — Other Ambulatory Visit (HOSPITAL_COMMUNITY): Payer: Self-pay | Admitting: Urology

## 2023-09-17 DIAGNOSIS — C61 Malignant neoplasm of prostate: Secondary | ICD-10-CM

## 2023-10-27 ENCOUNTER — Telehealth: Payer: Self-pay | Admitting: Radiation Oncology

## 2023-10-27 ENCOUNTER — Encounter (HOSPITAL_COMMUNITY): Payer: Self-pay

## 2023-10-27 NOTE — Telephone Encounter (Signed)
 1/6 @ 1:07 pm  Called patient back after received voicemail about rescheduling his consult from 1/8.  Looks like patient PET scan was cancel and has not been reschedule yet.  Waiting on call back.

## 2023-10-29 ENCOUNTER — Ambulatory Visit: Payer: Self-pay

## 2023-10-29 ENCOUNTER — Ambulatory Visit: Payer: Self-pay | Admitting: Radiation Oncology

## 2023-10-31 NOTE — Progress Notes (Signed)
 Cardiology Office Note:    Date:  11/03/2023  ID:  Dennis Chase, DOB 07/09/1961, MRN [id number] PCP: Ransom Other, MD  [facility] Providers Cardiologist:  Powell FORBES Sorrow, MD (Inactive)       Patient Profile:      Paroxysmal atrial fibrillation Not on anticoagulation secondary to low stroke risk Flecainide  Rx TTE 11/26/18: EF 60-65, NL RVSF, RVSP 20.3, mild dilation of ascending aorta  TTE 01/02/2022: EF 60-65, no RWMA, GLS -22.9, normal RVSF, normal PASP, mild LAE, aortic root 43 mm, ascending aorta 46 mm, RAP 8 Ascending thoracic aortic aneurysm CT 2017: 4.2 cm  TTE 12/2021: Ascending aorta 46 mm CT 01/2022: Aortic root 4.4 cm; 6 mm groundglass opacity RLL (patient refused pulmonology referral) Aortic atherosclerosis        History of Present Illness:  Discussed the use of AI scribe software for clinical note transcription with the patient, who gave verbal consent to proceed.  Dennis Chase is a 63 y.o. male who returns for follow-up of A-fib, ascending thoracic aortic aneurysm.  He was last seen by Dr. Sorrow in 12/2021. He was recently admitted in [place], [place] for SBO. I reviewed the discharge summary which notes he had an episode of atrial fibrillation with RVR managed with beta-blocker Rx. He is here alone. Post-hospitalization, the patient reported feeling unwell for several weeks, with symptoms of weakness and bloating. He also reported a weight loss of approximately 10 pounds within the last week. The patient notes sporadic episodes of AFib, which have been decreasing in frequency. These episodes were confirmed by wearing a Fitbit device, which recorded brief episodes of AFib during sleep. The patient reported being able to feel when he is in AFib while awake. The patient reported an increase in the frequency of AFib episodes since the fall, but the increase was not significant enough to establish a pattern. When in AFib, the patient experiences shortness of  breath but denies any chest pain. The patient remains physically active, engaging in regular biking, and denies any issues such as passing out, swelling in the legs, or difficulty breathing while laying flat. There were no reported bleeding issues, black stools, or bloody urine. The patient also disclosed a recent diagnosis of prostate cancer, with a probable surgery scheduled for the end of March or beginning of April. The patient is planning to retire and move back to [place]  full time at the end of April.      Review of Systems  Gastrointestinal:  Negative for hematochezia and melena.  Genitourinary:  Negative for hematuria.  -See HPI     Studies Reviewed:             Risk Assessment/Calculations:    CHA2DS2-VASc Score = 1   This indicates a 0.6% annual risk of stroke. The patient's score is based upon: CHF History: 0 HTN History: 0 Diabetes History: 0 Stroke History: 0 Vascular Disease History: 1 Age Score: 0 Gender Score: 0         Physical Exam:   VS:  BP 110/68 (BP Location: Left Arm, Patient Position: Sitting, Cuff Size: Normal)   Pulse (!) 58   Ht 5' 10 (1.778 m)   Wt 188 lb 6.4 oz (85.5 kg)   SpO2 95%   BMI 27.03 kg/m    Wt Readings from Last 3 Encounters:  11/03/23 188 lb 6.4 oz (85.5 kg)  01/09/22 182 lb (82.6 kg)  07/04/21 173 lb 6.4 oz (78.7 kg)    Constitutional:  Appearance: Healthy appearance. Not in distress.  Neck:     Vascular: JVD normal.  Pulmonary:     Breath sounds: Normal breath sounds. No wheezing. No rales.  Cardiovascular:     Normal rate. Regular rhythm.     Murmurs: There is no murmur.  Edema:    Peripheral edema absent.  Abdominal:     Palpations: Abdomen is soft.        Assessment and Plan:   Assessment & Plan Paroxysmal atrial fibrillation (HCC) He was recently admitted with SBO in [place]. He had recurrent AFib w RVR. He was started on Metoprolol  and converted to NSR. He has had an increase in frequency of atrial  fibrillation over the past year or so. CHADS2-VASc is 1 with hx of aortic atherosclerosis. His score will increase to 2 when he is 64. He would need anticoagulation at that point. I have recommended referral to EP for atrial fibrillation. He may be a candidate for PVI ablation. He is retired and is moving back to [place] in May. He would like to wait and establish with someone when he gets settled there. -Request EKG from Hospital in [place] >>EKG from [facility] in [place] dated 10/26/2023 personally reviewed and demonstrated sinus bradycardia, HR 57, normal axis, QRS 110, QTc 420 -Continue Flecainide  100 mg twice daily, Metoprolol  tartrate 12.5 mg twice daily  -Follow up here prn. He will establish with Cardiology in [place] after he moves.   Aneurysm of ascending aorta without rupture (HCC) Aortic root 44 mm on CT in 2023. He is due for repeat CT. He would like to wait and have this done after he moves. He knows to reach out to me if things change and he wants to have it here.         Dispo:  Return for follow up as needed.  Signed, Glendia Ferrier, PA-C

## 2023-11-03 ENCOUNTER — Encounter: Payer: Self-pay | Admitting: Physician Assistant

## 2023-11-03 ENCOUNTER — Ambulatory Visit: Payer: 59 | Attending: Physician Assistant | Admitting: Physician Assistant

## 2023-11-03 ENCOUNTER — Encounter (HOSPITAL_COMMUNITY)
Admission: RE | Admit: 2023-11-03 | Discharge: 2023-11-03 | Disposition: A | Discharge status: Home / Self Care | Payer: 59 | Source: Ambulatory Visit | Attending: Urology | Admitting: Urology

## 2023-11-03 VITALS — BP 110/68 | HR 58 | Ht 70.0 in | Wt 188.4 lb

## 2023-11-03 DIAGNOSIS — I480 Paroxysmal atrial fibrillation: Secondary | ICD-10-CM

## 2023-11-03 DIAGNOSIS — C61 Malignant neoplasm of prostate: Secondary | ICD-10-CM | POA: Diagnosis present

## 2023-11-03 DIAGNOSIS — I7121 Aneurysm of the ascending aorta, without rupture: Secondary | ICD-10-CM | POA: Diagnosis not present

## 2023-11-03 MED ORDER — FLOTUFOLASTAT F 18 GALLIUM 296-5846 MBQ/ML IV SOLN
8.0000 | Freq: Once | INTRAVENOUS | Status: AC
  Administered 2023-11-03: 8.65 via INTRAVENOUS
  Filled 2023-11-03: qty 8

## 2023-11-03 MED ORDER — METOPROLOL TARTRATE 25 MG PO TABS: 12.5000 mg | ORAL_TABLET | Freq: Two times a day (BID) | ORAL | 11 refills | Status: AC

## 2023-11-03 NOTE — Patient Instructions (Signed)
 Medication Instructions:  Your physician recommends that you continue on your current medications as directed. Please refer to the Current Medication list given to you today.  *If you need a refill on your cardiac medications before your next appointment, please call your pharmacy*   Lab Work: None ordered  If you have labs (blood work) drawn today and your tests are completely normal, you will receive your results only by: MyChart Message (if you have MyChart) OR A paper copy in the mail If you have any lab test that is abnormal or we need to change your treatment, we will call you to review the results.   Testing/Procedures: None ordered   Follow-Up: At [facility], you and your health needs are our priority.  As part of our continuing mission to provide you with exceptional heart care, we have created designated Provider Care Teams.  These Care Teams include your primary Cardiologist (physician) and Advanced Practice Providers (APPs -  Physician Assistants and Nurse Practitioners) who all work together to provide you with the care you need, when you need it.  We recommend signing up for the patient portal called MyChart.  Sign up information is provided on this After Visit Summary.  MyChart is used to connect with patients for Virtual Visits (Telemedicine).  Patients are able to view lab/test results, encounter notes, upcoming appointments, etc.  Non-urgent messages can be sent to your provider as well.   To learn more about what you can do with MyChart, go to [email or web address].    Your next appointment:   As needed  Provider:   Powell FORBES Sorrow, MD (Inactive)     Other Instructions

## 2023-11-03 NOTE — Assessment & Plan Note (Addendum)
 He was recently admitted with SBO in [place]. He had recurrent AFib w RVR. He was started on Metoprolol  and converted to NSR. He has had an increase in frequency of atrial fibrillation over the past year or so. CHADS2-VASc is 1 with hx of aortic atherosclerosis. His score will increase to 2 when he is 64. He would need anticoagulation at that point. I have recommended referral to EP for atrial fibrillation. He may be a candidate for PVI ablation. He is retired and is moving back to [place] in May. He would like to wait and establish with someone when he gets settled there. -Request EKG from Hospital in [place] >>EKG from [facility] in [place] dated 10/26/2023 personally reviewed and demonstrated sinus bradycardia, HR 57, normal axis, QRS 110, QTc 420 -Continue Flecainide  100 mg twice daily, Metoprolol  tartrate 12.5 mg twice daily  -Follow up here prn. He will establish with Cardiology in [place] after he moves.

## 2023-11-03 NOTE — Assessment & Plan Note (Signed)
 Aortic root 44 mm on CT in 2023. He is due for repeat CT. He would like to wait and have this done after he moves. He knows to reach out to me if things change and he wants to have it here.

## 2023-11-09 DIAGNOSIS — C61 Malignant neoplasm of prostate: Secondary | ICD-10-CM | POA: Insufficient documentation

## 2023-11-09 NOTE — Progress Notes (Signed)
Radiation Oncology         [phone] ________________________________  Initial Outpatient Consultation  Name: Dennis Chase MRN: [id number]  Date: 11/10/2023  DOB: 1960/12/10  WU:[id number], Jerelyn Scott, MD  Crist Fat, MD   REFERRING PHYSICIAN: Crist Fat, MD  DIAGNOSIS: 63 y.o. gentleman with Stage T1c adenocarcinoma of the prostate with Gleason score of 4+3, and PSA of 5.82.    ICD-10-CM   1. Malignant neoplasm of prostate (HCC)  C61       HISTORY OF PRESENT ILLNESS: Dennis Chase is a 63 y.o. male with a diagnosis of prostate cancer. 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 More recently, he was referred back to Dr. Marlou Porch in 07/2022 for a rising PSA of 4.74; digital rectal exam was performed at that time and was without nodularity or concerning findings. The PSA elevation was felt to possibly be related to the patient's recurrent UTI so he was started on a self-catheterization regimen and treated with antibiotics. When he returned for repeat PSA in 01/2023, it showed persistent and further elevation to 6.39. However, it decreased slightly to 5.82 on repeat labs in 05/2023. He underwent prostate MRI on 08/27/23 showing a PI-RADS 4 lesion in the right posterolateral peripheral zone at the base and mid gland. This was followed by an MRI fusion biopsy of the prostate which was performed on 09/04/23.  The prostate volume measured 31.5 cc.  Out of 15 core biopsies, 8 were positive.  The maximum Gleason score was 4+3, and this was seen in the right mid lateral (small focus) and right apex lateral. Additionally, Gleason 3+4 was seen in the right base lateral, right mid, left mid lateral as well as two cores from the MRI ROI, and a small focus of Gleason 3+3 was seen in the right apex.  He underwent staging PSMA PET scan on 11/03/23 that did not show any  evidence of metastatic disease.  The patient reviewed the biopsy results with his urologist and he has kindly been referred today for discussion of potential radiation treatment options.   PREVIOUS RADIATION THERAPY: No  PAST MEDICAL HISTORY:  Past Medical History:  Diagnosis Date   Adenocarcinoma of duodenum (HCC)    Complication of anesthesia    Ground glass opacity present on imaging of lung 02/01/2022   Chest CTA 01/2022: aortic root dilation 4.4 cm; 6 mm ground glass opacity RLL   Paroxysmal atrial fibrillation (HCC)    PONV (postoperative nausea and vomiting)    Spina bifida of lumbar region [facility])    Thoracic aortic aneurysm (HCC) 11/20/2018   Echocardiogram 3/23: EF 60-65, no RWMA, mild LVH, GLS -22.9, normal RVSF, normal PASP, mild LAE, aortic root 43 mm, ascending aorta 46 mm      PAST SURGICAL HISTORY: Past Surgical History:  Procedure Laterality Date   EXPLORATORY LAPAROTOMY W/ BOWEL RESECTION     resection of duodenum   FOOT SURGERY     multiple for spinal bifida   PROSTATE BIOPSY     SPINAL FIXATION SURGERY     VENTRAL HERNIA REPAIR      FAMILY HISTORY:  Family History  Problem Relation Age of Onset   Emphysema Mother    Atrial fibrillation Mother    Stroke Father    Bradycardia Father    Atrial fibrillation Father    Atrial fibrillation Brother    Atrial  fibrillation Brother    Atrial fibrillation Brother     SOCIAL HISTORY: He enjoys cycling regularly for exercise and stress management/mental health. He is planning to retire in 01/2024 and will likely move out of state at that time. 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   Overall Financial Resource Strain (CARDIA)    Difficulty of Paying Living Expenses: Not hard at all  Food Insecurity: No Food Insecurity (11/10/2023)   Hunger Vital Sign    Worried About Running Out of Food in the Last Year: Never true    Ran Out of Food in the Last Year: Never true  Transportation Needs: No Transportation Needs (11/10/2023)   PRAPARE - Administrator, Civil Service (Medical): No    Lack of Transportation (Non-Medical): No  Physical Activity: Not on file  Stress: No Stress Concern Present (10/22/2023)   Received from [facility]   [facility] of Occupational Health - Occupational Stress Questionnaire    Feeling of Stress : Only a little  Social Connections: Not on file  Intimate Partner Violence: Not At Risk (11/10/2023)   Humiliation, Afraid, Rape, and Kick questionnaire    Fear of Current or Ex-Partner: No    Emotionally Abused: No    Physically Abused: No    Sexually Abused: No    ALLERGIES: Morphine and Latex  MEDICATIONS:  Current Outpatient Medications  Medication Sig Dispense Refill   sildenafil (VIAGRA) 50 MG tablet Take by mouth.     aspirin EC 81 MG tablet Take 81 mg by mouth daily.     gabapentin (NEURONTIN) 300 MG capsule Take 300 mg by mouth daily.     ibuprofen (ADVIL,MOTRIN) 200 MG tablet Take 200 mg by mouth at bedtime as needed. For pain     metoprolol tartrate (LOPRESSOR) 25 MG tablet Take 0.5 tablets (12.5 mg total) by mouth 2 (two) times daily. 60 tablet 11   zolpidem (AMBIEN) 10 MG tablet Take 5 mg by mouth at bedtime as needed for sleep.   0   No current  facility-administered medications for this encounter.    REVIEW OF SYSTEMS:  On review of systems, the patient reports that he is doing well overall. He denies any chest pain, shortness of breath, cough, fevers, chills, night sweats, unintended weight changes. He denies any bowel disturbances, and denies abdominal pain, nausea or vomiting. He denies any new musculoskeletal or joint aches or pains. He performs clean intermittent catheterization 5x/day for his neurogenic bladder. His SHIM was 25, indicating he does not have erectile dysfunction. A complete review of systems is obtained and is otherwise negative.    PHYSICAL  EXAM:  Wt Readings from Last 3 Encounters:  11/10/23 186 lb 8 oz (84.6 kg)  11/03/23 188 lb 6.4 oz (85.5 kg)  01/09/22 182 lb (82.6 kg)   Temp Readings from Last 3 Encounters:  11/10/23 (!) 97.3 F (36.3 C) (Temporal)  02/23/15 97.7 F (36.5 C) (Oral)   BP Readings from Last 3 Encounters:  11/10/23 130/77  11/03/23 110/68  01/09/22 122/72   Pulse Readings from Last 3 Encounters:  11/10/23 69  11/03/23 (!) 58  01/09/22 67   Pain Assessment Pain Score: 0-No pain/10  In general this is a well appearing Caucasian male in no acute distress. He's alert and oriented x4 and appropriate throughout the examination. Cardiopulmonary assessment is negative for acute distress, and he exhibits normal effort.     KPS = 100  100 - Normal; no complaints; no evidence of disease. 90   - Able to carry on normal activity; minor signs or symptoms of disease. 80   - Normal activity with effort; some signs or symptoms of disease. 74   - Cares for self; unable to carry on normal activity or to do active work. 60   - Requires occasional assistance, but is able to care for most of his personal needs. 50   - Requires considerable assistance and frequent medical care. 40   - Disabled; requires special care and assistance. 30   - Severely disabled; hospital admission is indicated  although death not imminent. 20   - Very sick; hospital admission necessary; active supportive treatment necessary. 10   - Moribund; fatal processes progressing rapidly. 0     - Dead  Karnofsky DA, Abelmann WH, Craver LS and [facility] [place] [facility] [phone]) The use of the nitrogen mustards in the palliative treatment of carcinoma: with particular reference to bronchogenic carcinoma Cancer 1 634-56  LABORATORY DATA:  Lab Results  Component Value Date   WBC 5.6 02/22/2015   HGB 15.1 02/22/2015   HCT 43.1 02/22/2015   MCV 86.7 02/22/2015   PLT 192 02/22/2015   Lab Results  Component Value Date   NA 140 01/09/2022   K 4.6 01/09/2022   CL 103 01/09/2022   CO2 26 01/09/2022   Lab Results  Component Value Date   ALT 38 02/22/2015   AST 35 02/22/2015   ALKPHOS 52 02/22/2015   BILITOT 1.0 02/22/2015     RADIOGRAPHY: NM PET (PSMA) SKULL TO MID THIGH Result Date: 11/10/2023 CLINICAL DATA:  Newly diagnosed prostate carcinoma.  Staging. EXAM: NUCLEAR MEDICINE PET SKULL BASE TO THIGH TECHNIQUE: 8.7 mCi Flotufolastat (Posluma) was injected intravenously. Full-ring PET imaging was performed from the skull base to thigh after the radiotracer. CT data was obtained and used for attenuation correction and anatomic localization. COMPARISON:  None Available. FINDINGS: NECK No radiotracer activity in neck lymph nodes. Incidental CT finding: None. CHEST No radiotracer accumulation within mediastinal or hilar lymph nodes. No suspicious pulmonary nodules on the CT scan. Incidental CT finding: None. ABDOMEN/PELVIS Prostate: Focal radiotracer accumulation is seen right posterolateral peripheral zone, with SUV max of 6.8. A 2nd somewhat focal site of radiotracer accumulation seen in the left central gland, with SUV max of 1. Lymph nodes: No abnormal radiotracer accumulation within pelvic or abdominal nodes. Liver: No evidence of liver metastasis. Incidental CT finding: Benign-appearing hepatic cyst seen in the posterior left  hepatic lobe near the porta hepatis. Prior cholecystectomy. SKELETON No focal activity to suggest skeletal metastasis. IMPRESSION: Focal radiotracer accumulation in right posterolateral peripheral zone, consistent with known  prostate carcinoma. Second less prominent site of radiotracer accumulation in the left central gland, which may also represent prostate carcinoma. No evidence of local or distant metastatic disease. Electronically Signed   By: Danae Orleans M.D.   On: 11/10/2023 08:34      IMPRESSION/PLAN: 1. 63 y.o. gentleman with Stage T1c adenocarcinoma of the prostate with Gleason Score of 4+3, and PSA of 5.82. We discussed the patient's workup and outlined the nature of prostate cancer in this setting. The patient's T stage, Gleason's score, and PSA put him into the unfavorable intermediate risk group. Accordingly, he is eligible for a variety of potential treatment options including brachytherapy, 5.5 weeks of external radiation, or prostatectomy. We discussed the available radiation techniques, and focused on the details and logistics of delivery. We discussed and outlined the risks, benefits, short and long-term effects associated with radiotherapy and compared and contrasted these with prostatectomy. We discussed the role of SpaceOAR gel in reducing the rectal toxicity associated with radiotherapy.  He appears to have a good understanding of his disease and our treatment recommendations which are of curative intent.  He was encouraged to ask questions that were answered to his stated satisfaction.  At the conclusion of our conversation, the patient is undecided but appears to be most interested in moving forward with brachytherapy and use of SpaceOAR gel to reduce rectal toxicity from radiotherapy.  We will share our discussion with Dr. Marlou Porch and move forward with scheduling his [facility] planning appointment once he confirms his treatment decision.  He has our contact information and plans to  reach a decision within the week.  If he ultimately elects to proceed with the brachytherapy, he will be contacted by Darryl Nestle in our office who will be working closely with him to coordinate OR scheduling and pre and post procedure appointments.  We will contact the pharmaceutical rep to ensure that SpaceOAR is available at the time of procedure.  We enjoyed meeting him today and look forward to continuing to participate in his care.  We personally spent 70 minutes in this encounter including chart review, reviewing radiological studies, meeting face-to-face with the patient, entering orders and completing documentation.    Marguarite Arbour, PA-C    Margaretmary Dys, MD  [facility]  Radiation Oncology Direct Dial: [phone]  Fax: [phone] [email or web address]  Skype  LinkedIn   This document serves as a record of services personally performed by Margaretmary Dys, MD and Marcello Fennel, PA-C. It was created on their behalf by Mickie Bail, a trained medical scribe. The creation of this record is based on the scribe's personal observations and the provider's statements to them. This document has been checked and approved by the attending provider.

## 2023-11-10 ENCOUNTER — Ambulatory Visit
Admission: RE | Admit: 2023-11-10 | Discharge: 2023-11-10 | Disposition: A | Discharge status: Home / Self Care | Payer: 59 | Source: Ambulatory Visit | Attending: Radiation Oncology | Admitting: Radiation Oncology

## 2023-11-10 ENCOUNTER — Encounter: Payer: Self-pay | Admitting: Radiation Oncology

## 2023-11-10 VITALS — BP 130/77 | HR 69 | Temp 97.3°F | Resp 18 | Ht 70.0 in | Wt 186.5 lb

## 2023-11-10 DIAGNOSIS — Z79899 Other long term (current) drug therapy: Secondary | ICD-10-CM | POA: Diagnosis not present

## 2023-11-10 DIAGNOSIS — Q059 Spina bifida, unspecified: Secondary | ICD-10-CM | POA: Insufficient documentation

## 2023-11-10 DIAGNOSIS — C61 Malignant neoplasm of prostate: Secondary | ICD-10-CM | POA: Insufficient documentation

## 2023-11-10 DIAGNOSIS — I480 Paroxysmal atrial fibrillation: Secondary | ICD-10-CM | POA: Insufficient documentation

## 2023-11-10 DIAGNOSIS — Z7982 Long term (current) use of aspirin: Secondary | ICD-10-CM | POA: Insufficient documentation

## 2023-11-10 DIAGNOSIS — K7689 Other specified diseases of liver: Secondary | ICD-10-CM | POA: Insufficient documentation

## 2023-11-10 DIAGNOSIS — N319 Neuromuscular dysfunction of bladder, unspecified: Secondary | ICD-10-CM | POA: Diagnosis not present

## 2023-11-10 NOTE — Progress Notes (Signed)
GU Location of Tumor / Histology: Prostate Ca  If Prostate Cancer, Gleason Score is (4 + 3) and PSA is (5.82 on 06/03/2023)  Biopsies     08/27/2023 Dr. Berniece Salines MR Prostate with/without Contrast CLINICAL DATA: Elevated PSA level of 5.82 on 06/03/2023. R97.20   IMPRESSION: 1. PI-RADS category 4 lesion of the right posterolateral peripheral zone at the base and mid gland. Targeting data sent to [facility]. 2. Benign prostatic hypertrophy. 3. 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    Past/Anticipated interventions by urology, if any:  Urodynamics  11/17/2023 and meet with Dr. Marlou Porch 12/04/2023.  Past/Anticipated interventions by medical oncology, if any:  No  Weight changes, if any: No  IPSS:   21 reports uses catheter. SHIM:  25  Bowel/Bladder complaints, if any:  No  Nausea/Vomiting, if any: No  Pain issues, if any:  0/10  SAFETY ISSUES: Prior radiation? No Pacemaker/ICD? No Possible current pregnancy? Male Is the patient on methotrexate? No  Current Complaints / other details:

## 2023-11-10 NOTE — Progress Notes (Signed)
Introduced myself to the patient as the prostate nurse navigator.  No barriers to care identified at this time.  He is here to discuss his radiation treatment options.  I gave him my business card and asked him to call me with questions or concerns.  Verbalized understanding.  ?

## 2023-11-11 NOTE — Progress Notes (Signed)
Patient was a rad onc consult 11/10/23 for his stage T1c adenocarcinoma of the prostate with Gleason score of 4+3, and PSA of 5.82. Patient has confirmed he would like to proceed with brachytherapy around or just after March 15th.     MD's notified, plan of care in progress.

## 2023-11-17 ENCOUNTER — Telehealth: Payer: Self-pay | Admitting: *Deleted

## 2023-11-17 ENCOUNTER — Other Ambulatory Visit: Payer: Self-pay | Admitting: Urology

## 2023-11-17 ENCOUNTER — Telehealth: Payer: Self-pay | Admitting: Physician Assistant

## 2023-11-17 NOTE — Telephone Encounter (Signed)
Notes faxed to surgeon. This phone note will be removed from the preop pool. Tereso Newcomer, PA-C  11/17/2023 4:38 PM

## 2023-11-17 NOTE — Telephone Encounter (Signed)
   Pre-operative Risk Assessment    Patient Name: Dennis Chase  DOB: Feb 19, 1961 MRN: [id number]      Request for Surgical Clearance    Procedure:   Radioactive Seeds  Date of Surgery:  Clearance 01/08/24                                 Surgeon:  Dr. Vallery Ridge Group or Practice Name:  [facility] Phone number:  [phone] Fax number:  [phone]   Type of Clearance Requested:   - Medical  - Pharmacy:  Hold Aspirin 5 days   Type of Anesthesia:  General    Additional requests/questions:    SignedAndreas Blower   11/17/2023, 1:32 PM

## 2023-11-17 NOTE — Telephone Encounter (Signed)
CALLED PATIENT TO INFORM OF PRE-SEED APPTS. AND IMPLANT DATE, SPOKE WITH PATIENT AND HE IS AWARE OF THESE APPTS.

## 2023-11-17 NOTE — Telephone Encounter (Signed)
   Patient Name: Dennis Chase  DOB: 1960/11/23 MRN: [id number]  Primary Cardiologist: Meriam Sprague, MD (Inactive)  Patient well known to me and recently seen in the office. Chart reviewed as part of pre-operative protocol coverage. Given past medical history and time since last visit, based on ACC/AHA guidelines, JAYVEON CONVEY is at acceptable risk for the planned procedure without further cardiovascular testing.   If needed, aspirin can be held for 7 days prior to his procedure and resumed post op when safe.   I will route this recommendation to the requesting party via Epic fax function and remove from pre-op pool.  Please call with questions.  Tereso Newcomer, PA-C 11/17/2023, 4:34 PM

## 2023-12-17 ENCOUNTER — Telehealth: Payer: Self-pay | Admitting: *Deleted

## 2023-12-17 NOTE — Telephone Encounter (Signed)
 Called patient to remind of pre-seed appts. for 12-18-23, lvm for a return call

## 2023-12-18 ENCOUNTER — Ambulatory Visit
Admission: RE | Admit: 2023-12-18 | Discharge: 2023-12-18 | Disposition: A | Discharge status: Home / Self Care | Payer: 59 | Source: Ambulatory Visit | Attending: Urology | Admitting: Urology

## 2023-12-18 ENCOUNTER — Ambulatory Visit
Admission: RE | Admit: 2023-12-18 | Discharge: 2023-12-18 | Disposition: A | Discharge status: Home / Self Care | Payer: 59 | Source: Ambulatory Visit | Attending: Radiation Oncology | Admitting: Radiation Oncology

## 2023-12-18 ENCOUNTER — Encounter: Payer: Self-pay | Admitting: Urology

## 2023-12-18 VITALS — Resp 19 | Ht 70.5 in | Wt 187.0 lb

## 2023-12-18 DIAGNOSIS — C61 Malignant neoplasm of prostate: Secondary | ICD-10-CM | POA: Insufficient documentation

## 2023-12-18 NOTE — Progress Notes (Signed)
 Radiation Oncology         [phone] ________________________________  Outpatient Follow up- Pre-seed visit  Name: Dennis Chase MRN: [id number]  Date: 12/18/2023  DOB: 06-19-1961  NW:GNFAOZ, Jerelyn Scott, MD  Crist Fat, MD   REFERRING PHYSICIAN: Crist Fat, MD  DIAGNOSIS: 63 y.o. gentleman with Stage T1c adenocarcinoma of the prostate with Gleason score of 4+3, and PSA of 5.82.     ICD-10-CM   1. Malignant neoplasm of prostate (HCC)  C61       HISTORY OF PRESENT ILLNESS: Dennis Chase is a 63 y.o. male with a diagnosis of prostate cancer. 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 More recently, he was referred back to Dr. Marlou Porch in 07/2022 for a rising PSA of 4.74; digital rectal exam was performed at that time and was without nodularity or concerning findings. The PSA elevation was felt to possibly be related to the patient's recurrent UTI so he was started on a self-catheterization regimen and treated with antibiotics. When he returned for repeat PSA in 01/2023, it showed persistent and further elevation to 6.39. However, it decreased slightly to 5.82 on repeat labs in 05/2023. He underwent prostate MRI on 08/27/23 showing a PI-RADS 4 lesion in the right posterolateral peripheral zone at the base and mid gland. This was followed by an MRI fusion biopsy of the prostate which was performed on 09/04/23.  The prostate volume measured 31.5 cc.  Out of 15 core biopsies, 8 were positive.  The maximum Gleason score was 4+3, and this was seen in the right mid lateral (small focus) and right apex lateral. Additionally, Gleason 3+4 was seen in the right base lateral, right mid, left mid lateral as well as two cores from the MRI ROI, and a small focus of Gleason 3+3 was seen in the right apex.   He underwent staging PSMA PET scan on 11/03/23 that did not show  any evidence of metastatic disease.  The patient reviewed the biopsy results with his urologist and was kindly referred to [place] for discussion of potential radiation treatment options. We initially met the patient on 11/10/23 to discuss treatment options and he has since decided to proceed with brachytherapy and SpaceOAR gel placement for treatment of his disease. He is here today for his pre-procedure imaging for planning and to answer any additional questions he may have about this treatment.  He   PREVIOUS RADIATION THERAPY: No  PAST MEDICAL HISTORY:  Past Medical History:  Diagnosis Date   Adenocarcinoma of duodenum (HCC)    Complication of anesthesia    Ground glass opacity present on imaging of lung 02/01/2022   Chest CTA 01/2022: aortic root dilation 4.4 cm; 6 mm ground glass opacity RLL   Paroxysmal atrial fibrillation (HCC)    PONV (postoperative nausea and vomiting)    Spina bifida of lumbar region [facility])    Thoracic aortic aneurysm (HCC) 11/20/2018   Echocardiogram 3/23: EF 60-65, no RWMA, mild LVH, GLS -22.9, normal RVSF, normal PASP, mild LAE, aortic root 43 mm, ascending aorta 46 mm      PAST SURGICAL HISTORY: Past Surgical History:  Procedure Laterality Date   EXPLORATORY LAPAROTOMY W/ BOWEL RESECTION     resection of duodenum   FOOT SURGERY     multiple for spinal bifida   PROSTATE BIOPSY     SPINAL FIXATION SURGERY  VENTRAL HERNIA REPAIR      FAMILY HISTORY:  Family History  Problem Relation Age of Onset   Emphysema Mother    Atrial fibrillation Mother    Stroke Father    Bradycardia Father    Atrial fibrillation Father    Atrial fibrillation Brother    Atrial fibrillation Brother    Atrial fibrillation Brother     SOCIAL HISTORY:  Social History   Socioeconomic History   Marital status: Married    Spouse name: Not on file   Number of children: Not on file   Years of education: Not on file   Highest education level: Not on file  Occupational History    Not on file  Tobacco Use   Smoking status: Never   Smokeless tobacco: Never  Vaping Use   Vaping status: Never Used  Substance and Sexual Activity   Alcohol use: Yes    Alcohol/week: 0.0 standard drinks of alcohol    Comment: socially 1-2 times a week   Drug use: No   Sexual activity: Yes  Other Topics Concern   Not on file  Social History Narrative   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[employer] - [employer]   Social Drivers of Health   Financial Resource Strain: Low Risk  (10/22/2023)   Received from [facility]   Overall Financial Resource Strain (CARDIA)    Difficulty of Paying Living Expenses: Not hard at all  Food Insecurity: No Food Insecurity (12/18/2023)   Hunger Vital Sign    Worried About Running Out of Food in the Last Year: Never true    Ran Out of Food in the Last Year: Never true  Transportation Needs: No Transportation Needs (12/18/2023)   PRAPARE - Administrator, Civil Service (Medical): No    Lack of Transportation (Non-Medical): No  Physical Activity: Not on file  Stress: No Stress Concern Present (10/22/2023)   Received from [facility]   [facility] - Occupational Stress Questionnaire    Feeling of Stress : Only a little  Social Connections: Not on file  Intimate Partner Violence: Not At Risk (12/18/2023)   Humiliation, Afraid, Rape, and Kick questionnaire    Fear of Current or Ex-Partner: No    Emotionally Abused: No    Physically Abused: No    Sexually Abused: No    ALLERGIES: Morphine and Latex  MEDICATIONS:  Current Outpatient Medications  Medication Sig Dispense Refill   aspirin EC 81 MG tablet Take 81 mg by mouth daily.     gabapentin (NEURONTIN) 300 MG capsule Take 300 mg by mouth daily.     ibuprofen (ADVIL,MOTRIN) 200 MG tablet Take 200 mg by mouth at bedtime as needed. For pain     metoprolol tartrate (LOPRESSOR) 25 MG tablet Take 0.5 tablets (12.5 mg total) by mouth 2 (two) times daily. 60 tablet 11    sildenafil (VIAGRA) 50 MG tablet Take by mouth.     zolpidem (AMBIEN) 10 MG tablet Take 5 mg by mouth at bedtime as needed for sleep.   0   No current facility-administered medications for this encounter.    REVIEW OF SYSTEMS:  On review of systems, the patient reports that he is doing well overall. He denies any chest pain, shortness of breath, cough, fevers, chills, night sweats, unintended weight changes. He denies any bowel disturbances, and denies abdominal pain, nausea or vomiting. He denies any new musculoskeletal or joint aches or pains. He performs clean intermittent catheterization 5x/day for  his neurogenic bladder. His SHIM was 25, indicating he does not have erectile dysfunction. A complete review of systems is obtained and is otherwise negative.     PHYSICAL EXAM:  Wt Readings from Last 3 Encounters:  12/18/23 187 lb (84.8 kg)  11/10/23 186 lb 8 oz (84.6 kg)  11/03/23 188 lb 6.4 oz (85.5 kg)   Temp Readings from Last 3 Encounters:  11/10/23 (!) 97.3 F (36.3 C) (Temporal)  02/23/15 97.7 F (36.5 C) (Oral)   BP Readings from Last 3 Encounters:  11/10/23 130/77  11/03/23 110/68  01/09/22 122/72   Pulse Readings from Last 3 Encounters:  11/10/23 69  11/03/23 (!) 58  01/09/22 67   Pain Assessment Pain Score: 0-No pain/10  In general this is a well appearing Caucasian male in no acute distress. He's alert and oriented x4 and appropriate throughout the examination. Cardiopulmonary assessment is negative for acute distress, and he exhibits normal effort.     KPS = 100  100 - Normal; no complaints; no evidence of disease. 90   - Able to carry on normal activity; minor signs or symptoms of disease. 80   - Normal activity with effort; some signs or symptoms of disease. 46   - Cares for self; unable to carry on normal activity or to do active work. 60   - Requires occasional assistance, but is able to care for most of his personal needs. 50   - Requires considerable  assistance and frequent medical care. 40   - Disabled; requires special care and assistance. 30   - Severely disabled; hospital admission is indicated although death not imminent. 20   - Very sick; hospital admission necessary; active supportive treatment necessary. 10   - Moribund; fatal processes progressing rapidly. 0     - Dead  Karnofsky DA, Abelmann WH, Craver LS and Burchenal [facility] [phone]) The use of the nitrogen mustards in the palliative treatment of carcinoma: with particular reference to bronchogenic carcinoma Cancer 1 [phone]  LABORATORY DATA:  Lab Results  Component Value Date   WBC 5.6 02/22/2015   HGB 15.1 02/22/2015   HCT 43.1 02/22/2015   MCV 86.7 02/22/2015   PLT 192 02/22/2015   Lab Results  Component Value Date   NA 140 01/09/2022   K 4.6 01/09/2022   CL 103 01/09/2022   CO2 26 01/09/2022   Lab Results  Component Value Date   ALT 38 02/22/2015   AST 35 02/22/2015   ALKPHOS 52 02/22/2015   BILITOT 1.0 02/22/2015     RADIOGRAPHY: No results found.    IMPRESSION/PLAN: 1. 63 y.o. gentleman with Stage T1c adenocarcinoma of the prostate with Gleason score of 4+3, and PSA of 5.82.  The patient has elected to proceed with seed implant for treatment of his disease. We reviewed the risks, benefits, short and long-term effects associated with brachytherapy and discussed the role of SpaceOAR in reducing the rectal toxicity associated with radiotherapy.  We also detailed the role of ADT in the treatment of unfavorable intermediate risk prostate cancer and outlined the associated side effects that could be expected with this therapy. Given his low volume of Gleason 4+3 disease and high dose of radiation achieved with brachytherapy, we do not feel strongly that ADT is necessary in his case but discussed that it is an option. He plans to discuss this further with Dr. Marlou Porch in the near future. He appears to have a good understanding of his disease and our treatment  recommendations which  are of curative intent.  He was encouraged to ask questions that were answered to his stated satisfaction. He has freely signed written consent to proceed today in the office and a copy of this document will be placed in his medical record. His procedure is tentatively scheduled for 01/08/24 in collaboration with Dr. Marlou Porch and we will see him back for his post-procedure visit approximately 3 weeks thereafter. We look forward to continuing to participate in his care. He knows that he is welcome to call with any questions or concerns at any time in the interim.  I personally spent 30 minutes in this encounter including chart review, reviewing radiological studies, meeting face-to-face with the patient, entering orders and completing documentation.    Marguarite Arbour, MMS, PA-C [place]  [facility] Radiation Oncology Physician Assistant Direct Dial: [phone]  Fax: [phone]

## 2023-12-18 NOTE — Progress Notes (Signed)
  Radiation Oncology         (336) [phone] ________________________________  Name: HJALMAR BALLENGEE MRN: [id number]  Date: 12/18/2023  DOB: 01-Nov-1960  SIMULATION AND TREATMENT PLANNING NOTE PUBIC ARCH STUDY  NW:GNFAOZ, Jerelyn Scott, MD  Crist Fat, MD  DIAGNOSIS: 63 y.o. gentleman with Stage T1c adenocarcinoma of the prostate with Gleason score of 4+3, and PSA of 5.82.   Oncology History  Malignant neoplasm of prostate (HCC)  09/04/2023 Cancer Staging   Staging form: Prostate, AJCC 8th Edition - Clinical stage from 09/04/2023: Stage IIC (cT1c, cN0, cM0, PSA: 5.8, Grade Group: 3) - Signed by Marcello Fennel, PA-C on 11/09/2023 Histopathologic type: Adenocarcinoma, NOS Stage prefix: Initial diagnosis Prostate specific antigen (PSA) range: Less than 10 Gleason primary pattern: 4 Gleason secondary pattern: 3 Gleason score: 7 Histologic grading system: 5 grade system Number of biopsy cores examined: 15 Number of biopsy cores positive: 8 Location of positive needle core biopsies: Both sides   11/09/2023 Initial Diagnosis   Malignant neoplasm of prostate (HCC)       ICD-10-CM   1. Malignant neoplasm of prostate (HCC)  C61       COMPLEX SIMULATION:  The patient presented today for evaluation for possible prostate seed implant. He was brought to the radiation planning suite and placed supine on the CT couch. A 3-dimensional image study set was obtained in upload to the planning computer. There, on each axial slice, I contoured the prostate gland. Then, using three-dimensional radiation planning tools I reconstructed the prostate in view of the structures from the transperineal needle pathway to assess for possible pubic arch interference. In doing so, I did not appreciate any pubic arch interference. Also, the patient's prostate volume was estimated based on the drawn structure. The volume was 31 cc.  Given the pubic arch appearance and prostate volume, patient remains a good candidate to  proceed with prostate seed implant. Today, he freely provided informed written consent to proceed.    PLAN: The patient will undergo prostate seed implant.   ________________________________  Artist Pais. Kathrynn Running, M.D.

## 2023-12-18 NOTE — Progress Notes (Signed)
 Post-seed nursing interview for a diagnosis of Malignant neoplasm of prostate (HCC) Stage IIC (cT1c, cN0, cM0, PSA: 5.8, Grade Group: 3).  Patient identity verified x2.   Patient reports consistent weakened urinary functions. Patient denies any other related issues at this time.  Meaningful use complete.  I-PSS score- 12 - Moderate- Patient is currently using a self-cath for each restroom visit. SHIM score- 20 Urinary Management medication(s) None Urology appointment date- 01/07/24 with Dr. Marlou Porch at [facility]  Vitals- limited- Resp 19   Ht 5' 10.5" (1.791 m)   Wt 187 lb (84.8 kg)   BMI 26.45 kg/m   This concludes the interaction.  [name], LPN

## 2023-12-19 NOTE — Progress Notes (Signed)
 After further discussion with Dr. Marlou Porch, consensus agreement is that he is a good candidate for seed implant without ADT given his low volume of Gleason 4+3 disease and high dose of radiation achieved with brachytherapy.   RN spoke with patient and made him aware of of plan to proceed with brachytherapy with no hormonal therapy.  Patient verbalized understanding.

## 2024-01-05 NOTE — Patient Instructions (Signed)
 SURGICAL WAITING ROOM VISITATION  Patients having surgery or a procedure may have no more than 2 support people in the waiting area - these visitors may rotate.    Children under the age of 53 must have an adult with them who is not the patient.  Due to an increase in RSV and influenza rates and associated hospitalizations, children ages 87 and under may not visit patients in [facility] hospitals.  Visitors with respiratory illnesses are discouraged from visiting and should remain at home.  If the patient needs to stay at the hospital during part of their recovery, the visitor guidelines for inpatient rooms apply. Pre-op nurse will coordinate an appropriate time for 1 support person to accompany patient in pre-op.  This support person may not rotate.    Please refer to the [facility] website for the visitor guidelines for Inpatients (after your surgery is over and you are in a regular room).    Your procedure is scheduled on: 01/08/24   Report to [facility] Main Entrance    Report to admitting at 5:15 AM   Call this number if you have problems the morning of surgery [phone]   Do not eat food or drink liquids :After Midnight.          If you have questions, please contact your surgeon's office.   FOLLOW BOWEL PREP AND ANY ADDITIONAL PRE OP INSTRUCTIONS YOU RECEIVED FROM YOUR SURGEON'S OFFICE!!!     Oral Hygiene is also important to reduce your risk of infection.                                    Remember - BRUSH YOUR TEETH THE MORNING OF SURGERY WITH YOUR REGULAR TOOTHPASTE  DENTURES WILL BE REMOVED PRIOR TO SURGERY PLEASE DO NOT APPLY "Poly grip" OR ADHESIVES!!!   Stop all vitamins and herbal supplements 7 days before surgery.   Take these medicines the morning of surgery with A SIP OF WATER: Gabapentin, Metoprolol                               You may not have any metal on your body including jewelry, and body piercing             Do not wear lotions,  powders, cologne, or deodorant              Men may shave face and neck.   Do not bring valuables to the hospital. [facility] IS NOT             RESPONSIBLE   FOR VALUABLES.   Contacts, glasses, dentures or bridgework may not be worn into surgery.  DO NOT BRING YOUR HOME MEDICATIONS TO THE HOSPITAL. PHARMACY WILL DISPENSE MEDICATIONS LISTED ON YOUR MEDICATION LIST TO YOU DURING YOUR ADMISSION IN THE HOSPITAL!    Patients discharged on the day of surgery will not be allowed to drive home.  Someone NEEDS to stay with you for the first 24 hours after anesthesia.              Please read over the following fact sheets you were given: IF YOU HAVE QUESTIONS ABOUT YOUR PRE-OP INSTRUCTIONS PLEASE CALL [name]    If you received a COVID test during your pre-op visit  it is requested that you wear a mask when out in public,  stay away from anyone that may not be feeling well and notify your surgeon if you develop symptoms. If you test positive for Covid or have been in contact with anyone that has tested positive in the last 10 days please notify you surgeon.    [place] - Preparing for Surgery Before surgery, you can play an important role.  Because skin is not sterile, your skin needs to be as free of germs as possible.  You can reduce the number of germs on your skin by washing with CHG (chlorahexidine gluconate) soap before surgery.  CHG is an antiseptic cleaner which kills germs and bonds with the skin to continue killing germs even after washing. Please DO NOT use if you have an allergy to CHG or antibacterial soaps.  If your skin becomes reddened/irritated stop using the CHG and inform your nurse when you arrive at Short Stay. Do not shave (including legs and underarms) for at least 48 hours prior to the first CHG shower.  You may shave your face/neck.  Please follow these instructions carefully:  1.  Shower with CHG Soap the night before surgery and the  morning of  surgery.  2.  If you choose to wash your hair, wash your hair first as usual with your normal  shampoo.  3.  After you shampoo, rinse your hair and body thoroughly to remove the shampoo.                             4.  Use CHG as you would any other liquid soap.  You can apply chg directly to the skin and wash.  Gently with a scrungie or clean washcloth.  5.  Apply the CHG Soap to your body ONLY FROM THE NECK DOWN.   Do   not use on face/ open                           Wound or open sores. Avoid contact with eyes, ears mouth and   genitals (private parts).                       Wash face,  Genitals (private parts) with your normal soap.             6.  Wash thoroughly, paying special attention to the area where your    surgery  will be performed.  7.  Thoroughly rinse your body with warm water from the neck down.  8.  DO NOT shower/wash with your normal soap after using and rinsing off the CHG Soap.                9.  Pat yourself dry with a clean towel.            10.  Wear clean pajamas.            11.  Place clean sheets on your bed the night of your first shower and do not  sleep with pets. Day of Surgery : Do not apply any lotions/deodorants the morning of surgery.  Please wear clean clothes to the hospital/surgery center.  FAILURE TO FOLLOW THESE INSTRUCTIONS MAY RESULT IN THE CANCELLATION OF YOUR SURGERY  PATIENT SIGNATURE_________________________________  NURSE SIGNATURE__________________________________  ________________________________________________________________________

## 2024-01-05 NOTE — Progress Notes (Signed)
 COVID Vaccine Completed: yes  Date of COVID positive in last 90 days:  PCP - Georgann Housekeeper, MD Cardiologist - Laurance Flatten, MD  Cardiac clearance by Tereso Newcomer, PA 11/17/23 in Epic  PET- 11/10/23 Epic Chest x-ray - 10/25/23 [facility] EKG - 10/26/23 [facility] Stress Test - 2016 ECHO - 01/02/22 Epic Cardiac Cath - n/a Pacemaker/ICD device last checked: n/a Spinal Cord Stimulator: n/a  Bowel Prep - fleet enema, patient aware  Sleep Study - n/a CPAP -   Fasting Blood Sugar - n/a Checks Blood Sugar _____ times a day  Last dose of GLP1 agonist-  N/A GLP1 instructions:  Hold 7 days before surgery    Last dose of SGLT-2 inhibitors-  N/A SGLT-2 instructions:  Hold 3 days before surgery    Blood Thinner Instructions:  Last dose:   Time: Aspirin Instructions: ASA 81, hold 7 days Last Dose:  Activity level: Can go up a flight of stairs and perform activities of daily living without stopping and without symptoms of chest pain or shortness of breath.  Anesthesia review: a fib, thoracic aortic aneurysm,   Patient denies shortness of breath, fever, cough and chest pain at PAT appointment  Patient verbalized understanding of instructions that were given to them at the PAT appointment. Patient was also instructed that they will need to review over the PAT instructions again at home before surgery.

## 2024-01-07 ENCOUNTER — Encounter (HOSPITAL_COMMUNITY): Payer: Self-pay

## 2024-01-07 ENCOUNTER — Other Ambulatory Visit: Payer: Self-pay

## 2024-01-07 ENCOUNTER — Telehealth: Payer: Self-pay | Admitting: *Deleted

## 2024-01-07 ENCOUNTER — Encounter (HOSPITAL_COMMUNITY)
Admission: RE | Admit: 2024-01-07 | Discharge: 2024-01-07 | Disposition: A | Discharge status: Home / Self Care | Payer: 59 | Source: Ambulatory Visit | Attending: Urology | Admitting: Urology

## 2024-01-07 VITALS — BP 122/77 | HR 63 | Temp 97.9°F | Resp 16 | Ht 70.0 in | Wt 189.8 lb

## 2024-01-07 DIAGNOSIS — I4819 Other persistent atrial fibrillation: Secondary | ICD-10-CM | POA: Diagnosis not present

## 2024-01-07 DIAGNOSIS — I480 Paroxysmal atrial fibrillation: Secondary | ICD-10-CM | POA: Diagnosis not present

## 2024-01-07 DIAGNOSIS — C61 Malignant neoplasm of prostate: Secondary | ICD-10-CM | POA: Diagnosis not present

## 2024-01-07 DIAGNOSIS — Q059 Spina bifida, unspecified: Secondary | ICD-10-CM | POA: Insufficient documentation

## 2024-01-07 DIAGNOSIS — Z01812 Encounter for preprocedural laboratory examination: Secondary | ICD-10-CM | POA: Diagnosis present

## 2024-01-07 LAB — BASIC METABOLIC PANEL
Anion gap: 9 (ref 5–15)
BUN: 22 mg/dL (ref 8–23)
CO2: 23 mmol/L (ref 22–32)
Calcium: 8.6 mg/dL — ABNORMAL LOW (ref 8.9–10.3)
Chloride: 105 mmol/L (ref 98–111)
Creatinine, Ser: 0.88 mg/dL (ref 0.61–1.24)
GFR, Estimated: >60 mL/min (ref >60)
Glucose, Bld: 115 mg/dL — ABNORMAL HIGH (ref 70–99)
Potassium: 4.2 mmol/L (ref 3.5–5.1)
Sodium: 137 mmol/L (ref 135–145)

## 2024-01-07 LAB — CBC
HCT: 44.7 % (ref 39.0–52.0)
Hemoglobin: 15.1 g/dL (ref 13.0–17.0)
MCH: 31 pg (ref 26.0–34.0)
MCHC: 33.8 g/dL (ref 30.0–36.0)
MCV: 91.8 fL (ref 80.0–100.0)
Platelets: 229 10*3/uL (ref 150–400)
RBC: 4.87 MIL/uL (ref 4.22–5.81)
RDW: 13.2 % (ref 11.5–15.5)
WBC: 4.9 10*3/uL (ref 4.0–10.5)
nRBC: 0 % (ref 0.0–0.2)

## 2024-01-07 NOTE — Telephone Encounter (Signed)
 CALLED PATIENT TO REMIND OF PROCEDURE FOR 01-08-24, LVM FOR A RETURN CALL

## 2024-01-07 NOTE — Progress Notes (Signed)
 Anesthesia Chart Review   Case: [id number] Date/Time: 01/08/24 0715   Procedures:      INSERTION, RADIATION SOURCE, PROSTATE     INJECTION, HYDROGEL SPACER   Anesthesia type: General   Pre-op diagnosis: PROSTATE CANCER   Location: [facility] ROOM 08 / [facility]   Surgeons: Crist Fat, MD       DISCUSSION:63 y.o. never smoker with h/o spina bifida, PONV, PAF, thoracic aortic aneurysm, prostate cancer scheduled for above procedure 01/08/2024 with Dr. Berniece Salines.   Per cardiology preoperative evaluation 11/17/2023, "Patient well known to me and recently seen in the office. Chart reviewed as part of pre-operative protocol coverage. Given past medical history and time since last visit, based on ACC/AHA guidelines, Dennis Chase is at acceptable risk for the planned procedure without further cardiovascular testing.    If needed, aspirin can be held for 7 days prior to his procedure and resumed post op when safe. "  VS: BP 122/77   Pulse 63   Temp 36.6 C (Oral)   Resp 16   Ht 5\' 10"  (1.778 m)   Wt 86.1 kg   SpO2 99%   BMI 27.23 kg/m   PROVIDERS: Georgann Housekeeper, MD is PCP   Laurance Flatten, MD is Cardiologist  LABS: Labs reviewed: Acceptable for surgery. (all labs ordered are listed, but only abnormal results are displayed)  Labs Reviewed  CBC  BASIC METABOLIC PANEL     IMAGES:   EKG:   CV: Echo 01/02/2022 1. Left ventricular ejection fraction, by estimation, is 60 to 65%. Left  ventricular ejection fraction by PLAX is 62 %. The left ventricle has  normal function. The left ventricle has no regional wall motion  abnormalities. There is mild left ventricular  hypertrophy of the basal-septal segment. Left ventricular diastolic  parameters were normal. The average left ventricular global longitudinal  strain is -22.9 %. The global longitudinal strain is normal.   2. Right ventricular systolic function is normal. The right ventricular  size is normal. There is  normal pulmonary artery systolic pressure.   3. Left atrial size was mildly dilated.   4. The mitral valve is normal in structure. No evidence of mitral valve  regurgitation. No evidence of mitral stenosis.   5. The aortic valve is tricuspid. Aortic valve regurgitation is not  visualized. No aortic stenosis is present.   6. Aortic dilatation noted. There is mild dilatation of the aortic root,  measuring 43 mm. There is moderate dilatation of the ascending aorta,  measuring 46 mm.   7. The inferior vena cava is normal in size with <50% respiratory  variability, suggesting right atrial pressure of 8 mmHg.  Past Medical History:  Diagnosis Date   Adenocarcinoma of duodenum (HCC)    Complication of anesthesia    Ground glass opacity present on imaging of lung 02/01/2022   Chest CTA 01/2022: aortic root dilation 4.4 cm; 6 mm ground glass opacity RLL   Paroxysmal atrial fibrillation (HCC)    PONV (postoperative nausea and vomiting)    Spina bifida of lumbar region [facility])    Thoracic aortic aneurysm (HCC) 11/20/2018   Echocardiogram 3/23: EF 60-65, no RWMA, mild LVH, GLS -22.9, normal RVSF, normal PASP, mild LAE, aortic root 43 mm, ascending aorta 46 mm    Past Surgical History:  Procedure Laterality Date   EXPLORATORY LAPAROTOMY W/ BOWEL RESECTION     resection of duodenum   FOOT SURGERY     multiple for spinal bifida, 12-13  PROSTATE BIOPSY     SPINAL FIXATION SURGERY     VENTRAL HERNIA REPAIR      MEDICATIONS:  aspirin EC 81 MG tablet   famotidine (PEPCID) 40 MG tablet   gabapentin (NEURONTIN) 300 MG capsule   ibuprofen (ADVIL,MOTRIN) 200 MG tablet   metoprolol tartrate (LOPRESSOR) 25 MG tablet   sildenafil (VIAGRA) 50 MG tablet   zolpidem (AMBIEN) 10 MG tablet   No current facility-administered medications for this encounter.    [name], PA-C [facility] [phone]

## 2024-01-08 ENCOUNTER — Ambulatory Visit (HOSPITAL_COMMUNITY)

## 2024-01-08 ENCOUNTER — Ambulatory Visit (HOSPITAL_COMMUNITY)
Admission: RE | Admit: 2024-01-08 | Discharge: 2024-01-08 | Disposition: A | Discharge status: Home / Self Care | Payer: 59 | Source: Ambulatory Visit | Attending: Urology | Admitting: Urology

## 2024-01-08 ENCOUNTER — Ambulatory Visit (HOSPITAL_BASED_OUTPATIENT_CLINIC_OR_DEPARTMENT_OTHER): Admitting: Anesthesiology

## 2024-01-08 ENCOUNTER — Encounter (HOSPITAL_COMMUNITY): Payer: Self-pay | Admitting: Urology

## 2024-01-08 ENCOUNTER — Ambulatory Visit (HOSPITAL_COMMUNITY): Payer: Self-pay | Admitting: Physician Assistant

## 2024-01-08 ENCOUNTER — Encounter (HOSPITAL_COMMUNITY)
Admission: RE | Disposition: A | Discharge status: Home / Self Care | Payer: Self-pay | Source: Ambulatory Visit | Attending: Urology

## 2024-01-08 ENCOUNTER — Other Ambulatory Visit: Payer: Self-pay

## 2024-01-08 DIAGNOSIS — N318 Other neuromuscular dysfunction of bladder: Secondary | ICD-10-CM | POA: Diagnosis not present

## 2024-01-08 DIAGNOSIS — Q059 Spina bifida, unspecified: Secondary | ICD-10-CM | POA: Insufficient documentation

## 2024-01-08 DIAGNOSIS — C61 Malignant neoplasm of prostate: Secondary | ICD-10-CM

## 2024-01-08 DIAGNOSIS — Z87440 Personal history of urinary (tract) infections: Secondary | ICD-10-CM | POA: Insufficient documentation

## 2024-01-08 DIAGNOSIS — Z8042 Family history of malignant neoplasm of prostate: Secondary | ICD-10-CM | POA: Diagnosis not present

## 2024-01-08 HISTORY — PX: RADIOACTIVE SEED IMPLANT: SHX5150

## 2024-01-08 HISTORY — PX: SPACE OAR INSTILLATION: SHX6769

## 2024-01-08 SURGERY — INSERTION, RADIATION SOURCE, PROSTATE
Anesthesia: General | Site: Rectum

## 2024-01-08 MED ORDER — ACETAMINOPHEN 160 MG/5ML PO SOLN: 1000.0000 mg | Freq: Once | ORAL | Status: DC | PRN

## 2024-01-08 MED ORDER — METOPROLOL TARTRATE 25 MG PO TABS
ORAL_TABLET | ORAL | Status: AC
  Filled 2024-01-08: qty 1

## 2024-01-08 MED ORDER — MIDAZOLAM HCL 2 MG/2ML IJ SOLN
INTRAMUSCULAR | Status: DC | PRN
  Administered 2024-01-08: 2 mg via INTRAVENOUS

## 2024-01-08 MED ORDER — ORAL CARE MOUTH RINSE: 15.0000 mL | Freq: Once | OROMUCOSAL | Status: AC

## 2024-01-08 MED ORDER — LIDOCAINE HCL (PF) 2 % IJ SOLN
INTRAMUSCULAR | Status: AC
  Filled 2024-01-08: qty 5

## 2024-01-08 MED ORDER — STERILE WATER FOR IRRIGATION IR SOLN
Status: DC | PRN
  Administered 2024-01-08: 1000 mL

## 2024-01-08 MED ORDER — ROCURONIUM BROMIDE 100 MG/10ML IV SOLN
INTRAVENOUS | Status: DC | PRN
  Administered 2024-01-08 (×2): 10 mg via INTRAVENOUS
  Administered 2024-01-08: 50 mg via INTRAVENOUS

## 2024-01-08 MED ORDER — LACTATED RINGERS IV SOLN: INTRAVENOUS | Status: DC

## 2024-01-08 MED ORDER — PHENAZOPYRIDINE HCL 200 MG PO TABS: 200.0000 mg | ORAL_TABLET | Freq: Three times a day (TID) | ORAL | 0 refills | Status: DC | PRN

## 2024-01-08 MED ORDER — TRAMADOL HCL 50 MG PO TABS: 50.0000 mg | ORAL_TABLET | Freq: Four times a day (QID) | ORAL | 0 refills | Status: DC | PRN

## 2024-01-08 MED ORDER — CHLORHEXIDINE GLUCONATE 0.12 % MT SOLN
15.0000 mL | Freq: Once | OROMUCOSAL | Status: AC
  Administered 2024-01-08: 15 mL via OROMUCOSAL

## 2024-01-08 MED ORDER — DEXAMETHASONE SODIUM PHOSPHATE 10 MG/ML IJ SOLN
INTRAMUSCULAR | Status: AC
  Filled 2024-01-08: qty 1

## 2024-01-08 MED ORDER — PROPOFOL 10 MG/ML IV BOLUS
INTRAVENOUS | Status: DC | PRN
  Administered 2024-01-08: 130 mg via INTRAVENOUS

## 2024-01-08 MED ORDER — IOPAMIDOL (ISOVUE-300) INJECTION 61%
INTRAVENOUS | Status: DC | PRN
  Administered 2024-01-08: 5 mL

## 2024-01-08 MED ORDER — SODIUM CHLORIDE (PF) 0.9 % IJ SOLN
INTRAMUSCULAR | Status: AC
  Filled 2024-01-08: qty 10

## 2024-01-08 MED ORDER — OXYCODONE HCL 5 MG/5ML PO SOLN: 5.0000 mg | Freq: Once | ORAL | Status: DC | PRN

## 2024-01-08 MED ORDER — CIPROFLOXACIN IN D5W 400 MG/200ML IV SOLN
400.0000 mg | INTRAVENOUS | Status: AC
  Administered 2024-01-08: 400 mg via INTRAVENOUS
  Filled 2024-01-08: qty 200

## 2024-01-08 MED ORDER — EPHEDRINE SULFATE (PRESSORS) 50 MG/ML IJ SOLN: INTRAMUSCULAR | Status: DC | PRN

## 2024-01-08 MED ORDER — LIDOCAINE 2% (20 MG/ML) 5 ML SYRINGE
INTRAMUSCULAR | Status: DC | PRN
  Administered 2024-01-08: 60 mg via INTRAVENOUS

## 2024-01-08 MED ORDER — DEXAMETHASONE SODIUM PHOSPHATE 10 MG/ML IJ SOLN
INTRAMUSCULAR | Status: DC | PRN
  Administered 2024-01-08: 8 mg via INTRAVENOUS

## 2024-01-08 MED ORDER — FLEET ENEMA RE ENEM: 1.0000 | ENEMA | Freq: Once | RECTAL | Status: DC

## 2024-01-08 MED ORDER — SUGAMMADEX SODIUM 200 MG/2ML IV SOLN
INTRAVENOUS | Status: AC
  Filled 2024-01-08: qty 2

## 2024-01-08 MED ORDER — ONDANSETRON HCL 4 MG/2ML IJ SOLN
INTRAMUSCULAR | Status: AC
  Filled 2024-01-08: qty 2

## 2024-01-08 MED ORDER — FENTANYL CITRATE PF 50 MCG/ML IJ SOSY: 25.0000 ug | PREFILLED_SYRINGE | INTRAMUSCULAR | Status: DC | PRN

## 2024-01-08 MED ORDER — ROCURONIUM BROMIDE 10 MG/ML (PF) SYRINGE
PREFILLED_SYRINGE | INTRAVENOUS | Status: AC
  Filled 2024-01-08: qty 10

## 2024-01-08 MED ORDER — FENTANYL CITRATE (PF) 100 MCG/2ML IJ SOLN
INTRAMUSCULAR | Status: AC
  Filled 2024-01-08: qty 2

## 2024-01-08 MED ORDER — OXYCODONE HCL 5 MG PO TABS: 5.0000 mg | ORAL_TABLET | Freq: Once | ORAL | Status: DC | PRN

## 2024-01-08 MED ORDER — EPHEDRINE SULFATE-NACL 50-0.9 MG/10ML-% IV SOSY
PREFILLED_SYRINGE | INTRAVENOUS | Status: DC | PRN
  Administered 2024-01-08 (×6): 10 mg via INTRAVENOUS

## 2024-01-08 MED ORDER — EPHEDRINE 5 MG/ML INJ
INTRAVENOUS | Status: AC
  Filled 2024-01-08: qty 5

## 2024-01-08 MED ORDER — MIDAZOLAM HCL 2 MG/2ML IJ SOLN
INTRAMUSCULAR | Status: AC
  Filled 2024-01-08: qty 2

## 2024-01-08 MED ORDER — ACETAMINOPHEN 500 MG PO TABS: 1000.0000 mg | ORAL_TABLET | Freq: Once | ORAL | Status: DC | PRN

## 2024-01-08 MED ORDER — PROPOFOL 10 MG/ML IV BOLUS
INTRAVENOUS | Status: AC
  Filled 2024-01-08: qty 20

## 2024-01-08 MED ORDER — FLEET ENEMA RE ENEM: 1.0000 | ENEMA | RECTAL | Status: DC

## 2024-01-08 MED ORDER — ACETAMINOPHEN 10 MG/ML IV SOLN: 1000.0000 mg | Freq: Once | INTRAVENOUS | Status: DC | PRN

## 2024-01-08 MED ORDER — METOPROLOL TARTRATE 25 MG PO TABS
12.5000 mg | ORAL_TABLET | ORAL | Status: AC
  Administered 2024-01-08: 12.5 mg via ORAL

## 2024-01-08 MED ORDER — FENTANYL CITRATE (PF) 100 MCG/2ML IJ SOLN
INTRAMUSCULAR | Status: DC | PRN
Start: 2024-01-08 — End: 2024-01-08
  Administered 2024-01-08: 100 ug via INTRAVENOUS

## 2024-01-08 MED ORDER — SUGAMMADEX SODIUM 200 MG/2ML IV SOLN
INTRAVENOUS | Status: DC | PRN
  Administered 2024-01-08: 100 mg via INTRAVENOUS

## 2024-01-08 MED ORDER — SODIUM CHLORIDE (PF) 0.9 % IJ SOLN
INTRAMUSCULAR | Status: DC | PRN
  Administered 2024-01-08: 10 mL

## 2024-01-08 SURGICAL SUPPLY — 34 items
BAG COUNTER SPONGE SURGICOUNT (BAG) IMPLANT
BAG URINE DRAIN 2000ML AR STRL (UROLOGICAL SUPPLIES) ×1 IMPLANT
BARD QuickLink Cartidges with BrachySource I-125 S IMPLANT
CATH FOLEY 2WAY SLVR 5CC 16FR (CATHETERS) ×2 IMPLANT
CATH ROBINSON RED A/P 20FR (CATHETERS) ×1 IMPLANT
CATH SILICONE 14FRX5CC (CATHETERS) IMPLANT
COVER BACK TABLE 60X90IN (DRAPES) ×1 IMPLANT
COVER MAYO STAND STRL (DRAPES) ×1 IMPLANT
COVER SURGICAL LIGHT HANDLE (MISCELLANEOUS) ×1 IMPLANT
DRAPE U-SHAPE 47X51 STRL (DRAPES) ×1 IMPLANT
DRSG TEGADERM 4X4.75 (GAUZE/BANDAGES/DRESSINGS) ×2 IMPLANT
DRSG TEGADERM 8X12 (GAUZE/BANDAGES/DRESSINGS) ×2 IMPLANT
GLOVE BIO SURGEON STRL SZ7.5 (GLOVE) ×1 IMPLANT
GLOVE ECLIPSE 8.0 STRL XLNG CF (GLOVE) ×1 IMPLANT
GLOVE SURG LX STRL 7.5 STRW (GLOVE) ×2 IMPLANT
GOWN STRL REUS W/ TWL LRG LVL3 (GOWN DISPOSABLE) ×2 IMPLANT
GRID BRACH TEMP 18GA 2.8X3X.75 (MISCELLANEOUS) IMPLANT
HOLDER FOLEY CATH W/STRAP (MISCELLANEOUS) ×1 IMPLANT
IMPL SPACEOAR SYSTEM 10ML (Spacer) ×1 IMPLANT
IMPLANT SPACEOAR SYSTEM 10ML (Spacer) ×1 IMPLANT
KIT TURNOVER KIT A (KITS) IMPLANT
MARKER SKIN DUAL TIP RULER LAB (MISCELLANEOUS) ×1 IMPLANT
NDL BRACHY 18G 5PK (NEEDLE) ×1 IMPLANT
NDL BRACHY 18G SINGLE (NEEDLE) ×1 IMPLANT
NDL PK MORGANSTERN STABILIZ (NEEDLE) IMPLANT
NEEDLE BRACHY 18G 5PK (NEEDLE) ×1 IMPLANT
NEEDLE BRACHY 18G SINGLE (NEEDLE) ×1 IMPLANT
NEEDLE PK MORGANSTERN STABILIZ (NEEDLE) IMPLANT
PACK CYSTO (CUSTOM PROCEDURE TRAY) ×1 IMPLANT
PENCIL SMOKE EVACUATOR (MISCELLANEOUS) IMPLANT
SURGILUBE 2OZ TUBE FLIPTOP (MISCELLANEOUS) ×1 IMPLANT
SYR 10ML LL (SYRINGE) ×1 IMPLANT
TOWEL OR 17X26 10 PK STRL BLUE (TOWEL DISPOSABLE) ×1 IMPLANT
UNDERPAD 30X36 HEAVY ABSORB (UNDERPADS AND DIAPERS) ×2 IMPLANT

## 2024-01-08 NOTE — Progress Notes (Signed)
  Radiation Oncology         [phone] ________________________________  Name: LIONELL MATUSZAK MRN: [id number]  Date: 01/08/2024  DOB: 06-11-1961       Prostate Seed Implant  WU:[id number], Jerelyn Scott, MD  No ref. provider found  DIAGNOSIS:  63 y.o. gentleman with Stage T1c adenocarcinoma of the prostate with Gleason score of 4+3, and PSA of 5.82.   Oncology History  Malignant neoplasm of prostate (HCC)  09/04/2023 Cancer Staging   Staging form: Prostate, AJCC 8th Edition - Clinical stage from 09/04/2023: Stage IIC (cT1c, cN0, cM0, PSA: 5.8, Grade Group: 3) - Signed by Marcello Fennel, PA-C on 11/09/2023 Histopathologic type: Adenocarcinoma, NOS Stage prefix: Initial diagnosis Prostate specific antigen (PSA) range: Less than 10 Gleason primary pattern: 4 Gleason secondary pattern: 3 Gleason score: 7 Histologic grading system: 5 grade system Number of biopsy cores examined: 15 Number of biopsy cores positive: 8 Location of positive needle core biopsies: Both sides   11/09/2023 Initial Diagnosis   Malignant neoplasm of prostate (HCC)       ICD-10-CM   1. Malignant neoplasm of prostate [facility])  C61 Discharge patient      PROCEDURE: Insertion of radioactive I-125 seeds into the prostate gland.  RADIATION DOSE: 145 Gy, definitive therapy.  TECHNIQUE: SAYID MOLL was brought to the operating room with the urologist. He was placed in the dorsolithotomy position. He was catheterized and a rectal tube was inserted. The perineum was shaved, prepped and draped. The ultrasound probe was then introduced by me into the rectum to see the prostate gland.  TREATMENT DEVICE: I attached the needle grid to the ultrasound probe stand and anchor needles were placed.  3D PLANNING: The prostate was imaged in 3D using a sagittal sweep of the prostate probe. These images were transferred to the planning computer. There, the prostate, urethra and rectum were defined on each axial reconstructed image.  Then, the software created an optimized 3D plan and a few seed positions were adjusted. The quality of the plan was reviewed using [facility] information for the target and the following two organs at risk:  Urethra and Rectum.  Then the accepted plan was printed and handed off to the radiation therapist.  Under my supervision, the custom loading of the seeds and spacers was carried out using the quick loader.  These pre-loaded needles were then placed into the needle holder.Marland Kitchen  PROSTATE VOLUME STUDY:  Using transrectal ultrasound the volume of the prostate was verified to be 30.1 cc.  SPECIAL TREATMENT PROCEDURE/SUPERVISION AND HANDLING: The pre-loaded needles were then delivered by the urologist under sagittal guidance. A total of 17 needles were used to deposit 60 seeds in the prostate gland. The individual seed activity was 0.400 mCi.  SpaceOAR:  Yes  COMPLEX SIMULATION: At the end of the procedure, an anterior radiograph of the pelvis was obtained to document seed positioning and count. Cystoscopy was performed by the urologist to check the urethra and bladder.  MICRODOSIMETRY: At the end of the procedure, the patient was emitting 0.097 mR/hr at 1 meter. Accordingly, he was considered safe for hospital discharge.  PLAN: The patient will return to the radiation oncology clinic for post implant CT dosimetry in three weeks.   ________________________________  Artist Pais Kathrynn Running, M.D.

## 2024-01-08 NOTE — H&P (Signed)
 Prostate cancer and neurogenic bladder   The patient actually has a history of neurogenic bladder, because of spina bifida. He uses abdominal muscles and strains to void. He has a very weak stream. He has a history of recurrent urinary tract infections. He also has recurrent urinary tract infections as a result. He states that over the last several months he has had a chronic UTI notable mostly because of the foul smell and the discomfort associated with voiding. He was started on antibiotics last week for this.   The patient does have a family history of prostate cancer. His dad was diagnosed with prostate cancer in his late 40s and subsequently treated with external beam radiation. He remains active and alive today.   PSA history:  02/14/2022: 4.33  06/09/2022: 4.74  4/24: 6.38  8/24: 5.82   Started cathing 5-6 times daily and his voiding symptoms have improved remarkably. He is no longer incontinent.   Prostate cancer profile  Stage: T1c  PSA: 5.83  Prostate MRI 08/27/2023: PI-RADS 4 in right posterior lateral peripheral zone at the base and mid gland  MRI fusion biopsy 11/24, 6/12 cores positive: Cores at the right lateral mid and apex demonstrated 4+3 = 7  ROI #1-PI-RADS 4 lesion, 3/3 cores positive Gleason 3+4 = 7  Prostate volume: 31.5 g   Prostate cancer nomogram after radical prostatectomy:  LNI -7%  PFS (surgery)-61% at 4 years, 55% at 9 years   UDS: Fairly small bladder capacity, no detrusor function at all, strains to void. No incontinence.   Interval: Today the patient is here for follow-up and further discussion of treatment for his prostate cancer. He did have urodynamics prior to his appointment today, this confirmed no detrusor function. Continues to cath 5 6 times daily and he is doing well with that. Fortunately he is no longer incontinent. He has decided to proceed with brachytherapy. We have today not yet discussed ADT.     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, duodenal surgery   NON-GU PSH: Cataract surgery, Bilateral, 2020 Surgical Pathology, Gross And Microscopic Examination For Prostate Needle - 09/04/2023 Visit Complexity (formerly GPC1X) - 09/11/2023     GU PMH: Urinary Retention, Unspec - 11/17/2023 Bladder, Neuromuscular dysfunction, Unspec - 09/11/2023, - 06/10/2023, - 02/11/2023, - 08/12/2022, Neurogenic bladder, - 2016 Prostate Cancer - 09/11/2023 Elevated PSA - 09/04/2023, - 06/10/2023, - 02/11/2023, - 08/12/2022 Weak Urinary Stream - 08/12/2022, Weak urinary stream, - 2016 Urinary Tract Inf, Unspec site, Urinary tract infection - 2016 Epididymitis, Epididymitis - 2016 Hydronephrosis Unspec, Hydronephrosis - 2016      PMH Notes: Leg weakness, osteoarthritis, H/O gastrointestinal stromal tumor of stomach, H/O 5 mm lung nodule seen on 2017 CT scan, meningocele as infant   NON-GU PMH: Encounter for general adult medical examination without abnormal findings, Encounter for preventive health examination - 2016 Other congenital malformations of spine, 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   Tobacco Use Assessment Completed: Used Tobacco in last 30 days? Social Drinker.  Drinks 1 caffeinated drink per day. Patient's occupation Optician, dispensing & performer.     Notes: Never a smoker, Married, Caffeine use, Alcohol use, Occupation   REVIEW OF SYSTEMS:    GU Review Male:   Patient denies frequent urination, hard to postpone urination, burning/ pain with urination, get up at night to urinate, leakage of urine, stream starts and stops, trouble starting your stream, have to strain to urinate , erection problems, and penile pain.  Gastrointestinal (Upper):   Patient denies nausea, vomiting, and indigestion/ heartburn.  Gastrointestinal (Lower):   Patient denies diarrhea and constipation.  Constitutional:   Patient denies fever, night sweats, weight loss, and fatigue.  Skin:   Patient denies skin rash/ lesion and itching.  Eyes:   Patient denies blurred vision and double vision.  Ears/ Nose/ Throat:   Patient denies sore throat and sinus problems.  Hematologic/Lymphatic:   Patient denies swollen glands and easy bruising.  Cardiovascular:   Patient denies leg swelling and chest pains.  Respiratory:   Patient denies cough and shortness of breath.  Endocrine:   Patient denies excessive thirst.  Musculoskeletal:   Patient denies back pain and joint pain.  Neurological:   Patient denies headaches and dizziness.  Psychologic:   Patient denies depression and anxiety.   VITAL SIGNS: None   Complexity of Data:  Source Of History:  Patient  Lab Test Review:   PSA  Records Review:   Pathology Reports, Previous Doctor Records, Previous Patient Records, POC Tool  Urine Test Review:   Urinalysis  Urodynamics Review:   Review Urodynamics Tests   06/03/23 01/20/23  PSA  Total PSA 5.82 ng/mL 6.39 ng/mL    PROCEDURES:          Visit Complexity - G2211    ASSESSMENT:       ICD-10 Details  1 GU:   Prostate Cancer - C61   2   Bladder, Neuromuscular dysfunction, Unspec - N31.9      PLAN:            Medications New Meds: Orgovyx 120 mg tablet 1 tablet PO Daily   #30  5 Refill(s)  Pharmacy Name:  [facility]  Address:  [address] [place], [place] [address]  Phone:  [phone]  Fax:  [phone]            Document Letter(s):  Created for Patient: Clinical Summary         Notes:   The patient is scheduled for brachytherapy 1 month from now. I discussed ADT with him. We discussed 6 months. We discussed the pros and cons. We also discussed Eligard versus Orgovyx. Having gone through all of it the patient will continue to consider it, but if he does choose to do it, would like to proceed with Orgovyx. Will go ahead and get this ordered for him and through the insurance company. If it is prohibitively expensive we will switch gears and plan for Eligard.

## 2024-01-08 NOTE — Discharge Instructions (Signed)
 DISCHARGE INSTRUCTIONS FOR PROSTATE SEED IMPLANTATION  Activity:    Rest for the remainder of the day.  Do not drive or operate equipment today.  You may resume normal  activities in a few days as instructed by your physician, without risk of harmful radiation exposure to those around you, provided you follow the time and distance precautions on the Radiation Oncology Instruction Sheet.   Meals: Drink plenty of lipuids and eat light foods, such as gelatin or soup this evening .  You may return to normal meal plan tomorrow.  Return To Work: You may return to work as instructed by Designer, multimedia.  Special Instruction:   If any seeds are found, use tweezers to pick up seeds and place in a glass container of any kind and bring to your physician's office.  Call your physician if any of these symptoms occur:  Persistent or heavy bleeding Urine stream diminishes or stops completely after catheter is removed Fever equal to or greater than 101 degrees F Cloudy urine with a strong foul odor Severe pain  You may feel some burning pain and/or hesitancy when you urinate after the catheter is removed.  These symptoms may increase over the next few weeks, but should diminish within forur to six weeks.  Applying moist heat to the lower abdomen or a hot tub bath may help relieve the pain.  If the discomfort becomes severe, please call your physician for additional medications.    PROSTATE CANCER TREATMENT WITH RADIOACTIVE IODINE-125 SEED IMPLANT  This instruction sheet is intended to discuss implantation of Iodine-125 seeds as treatment for cancer of the prostate. It will explain in detail what you may expect from this treatment and what precautions are necessary as a result of the treatment. Iodine-125 emits a relatively low energy radiation. The radioactive seeds are surgically implanted directly into the prostate gland. Most of the radiation is contained within the prostate gland. A very small amount  is present outside the body.The precautions that we ask you to take are to ensure that those around you are protected from unnecessary radiation. The principles of radiation safety that you need to understand are:  DISTANCE: The further a person is from the radioactive implant the less radiation they will be receiving. The amount of radiation received falls off quite rapidly with distance. More specific guidelines are given in the table on the last page.  TIME: The amount of radiation a person is exposed to is directly proportional to the amount of time that is spent in close proximity to the radioactive implant. Very little radiation will be received during short periods. See the table on the last page for more specific guideline.  CHILDREN UNDER AGE 35 Children should not be allowed to sit on your lap or otherwise be in very close contact for more than a few minutes for the first 6-8 weeks following the implant. You may affectionately greet (hug/kiss) a child for a short period of time, but remember, the longer you are in close proximity with that child the more radiation they are being exposed to. At a distance of 6 feet there is no limit to the length of time you may spend together. See specific guidelines on the last page.  PREGNANT OR POSSIBLY PREGNANT WOMEN Pregnant women should avoid prolonged close physical contact with you for the first 6-8 weeks after implant. At a distance of 6 feet there is no limit to the length of time you may spend together. Pregnant women or possibly  pregnant women can safely be in close contact with you for a limited period of time. See the last page for guidelines.  FAMILY RELATIONS You may sleep in the same bed as your partner (provided she is not pregnant or under the age of 75). Sexual intercourse, using a condom, may be resumed 2 weeks after the implant. Your semen may be discolored, dark brown or black. This is normal and is the result of bleeding that may have  occurred during the implant. After 3-4 weeks it will not be necessary to use a condom.  DAILY ACTIVITIES You may resume normal activities in a few days (example: work, shopping, church) without the risk of harmful radiation exposure to those around you provided you keep in mind the time and distance precautions. Objects that you touch or item that you use do not become radioactive. Linens, clothing, tableware, and dishes may be used by other persons without special precautions. Your bodily wastes (urine and stool) are not radioactive.  SPECIAL PRECAUTIONS It is possible to lose implanted Iodine-125 seed(s) through urination. Although it is possible to pass seeds indefinitely, it is most likely to occur immediately after catheter removal. To prevent this from happening the catheter that was in place during the implant procedure is removed immediately after the implant and a cystoscopy procedure is performed. The process of removing the catheter and the cystoscopy procedure should dislodge and remove any seeds that are not firmly imbedded in the prostate tissue. However, you should watch for seeds if/when you remove your catheter at home. The seeds are silver colored and the size of a grain of rice. In the unlikely event that a seed is seen after urination, simply flush the seed down the toilet. The seed should not be handled with your fingers, not even with a glove or napkin. A spoon or tweezers can be used to pick up a seed. The Radiation Oncology department is open Monday - Friday from 8:00 am to 5:30 pm with a Radiation Oncologist on call at all times. He or she may be reached by calling [phone]. If you are to be hospitalized or if death should occur, your family should notify the Actor.  SIDE EFFECTS There are very few side effects associate with the implant procedure. Minor burning with urination, weak stream, hesitancy, intermittency, frequency, mild pain or feeling unable to  pass your urine freely are common and usually stop in one to four months. If these symptoms are extremely uncomfortable, contact your physician.  RADIATION SAFETY GUIDELINES PROSTATE CANCER TREATMENT WITH RADIOACTIVE IODINE-125 SEED IMPLANT  The following guidelines will limit exposure to less than naturally occurring background radiation.  PERSONS AGE 77-45 (if able to become pregnant)  FOR 8 WEEKS FOLLOWING IMPLANT  At a distance of 1 foot: limit time to less than 2 hours/week At a distance of 3 feet: limit time to 20 hours/week At a distance of 6 feet: no restrictions  AFTER 8 WEEKS No restrictions  CHILDREN UNDER AGE 77, PREGNANT WOMEN OR POSSIBLY PREGNANT WOMEN  FOR 8 WEEKS FOLLOWING IMPLANT At a distance of 1 foot: limit time to 10 minutes/week At a distance of 3 feet: limit time to 2 hours/week At a distance of 6 feet: no restrictions  AFTER 8 WEEKS No restrictions  PERSONS OVER THE AGE OF 45 AND DO NOT EXPECT TO HAVE ANY MORE CHILDREN No restrictions  Updated by SCP in January 2020

## 2024-01-08 NOTE — Transfer of Care (Signed)
 Immediate Anesthesia Transfer of Care Note  Patient: Dennis Chase  Procedure(s) Performed: INSERTION, RADIATION SOURCE, PROSTATE (Rectum) INJECTION, HYDROGEL SPACER (Rectum)  Patient Location: PACU  Anesthesia Type:General  Level of Consciousness: awake, alert , and oriented  Airway & Oxygen Therapy: Patient Spontanous Breathing and Patient connected to face mask oxygen  Post-op Assessment: Report given to RN and Post -op Vital signs reviewed and stable  Post vital signs: Reviewed and stable  Last Vitals:  Vitals Value Taken Time  BP 137/82 01/08/24 0941  Temp    Pulse 59 01/08/24 0943  Resp 15 01/08/24 0943  SpO2 100 % 01/08/24 0943  Vitals shown include unfiled device data.  Last Pain:  Vitals:   01/08/24 0608  TempSrc: Oral         Complications: No notable events documented.

## 2024-01-08 NOTE — Op Note (Signed)
 Preoperative diagnosis:  Clinical stage TI C adenocarcinoma the prostate  Postoperative diagnosis:  Same  Procedure:  #1 I-125 prostate seed implantation  #2 cystourethroscopy #3 instillation of SpaceOAR biogel  Surgeon: Berniece Salines, M.D. Radiation Oncologist: Margaretmary Dys, M.D.  Anesthesia: Gen.   Findings:  #1 - distal bulbar stricture ~27F - unable to get 5F foley but about to pass a 27F with some resistance.  The 64f cystoscope was able to pass the area with some gentle pressure.  Indications: Patient  was diagnosed with clinical stage TI C prostate cancer. We had extensive discussion with him about treatment options versus. He elected to proceed with seed implantation. He underwent consultation my office as well as with Dr. Margaretmary Dys. He appeared to understand the advantages disadvantages potential risks of this treatment option. Full informed consent has been obtained. The patient is had preoperative ciprofloxacin. PAS compression boots were placed.   Technique and findings: Patient was brought the operating room where he had  successful induction of general anesthesia. He was placed in lithotomy position and prepped and draped in usual manner. Appropriate surgical timeout was performed. Radiation oncology department placed a transrectal ultrasound probe anchoring stand. Foley catheter with contrast in the balloon was inserted without difficulty. Anchoring needles were placed within the prostate.  Real-time contouring of the urethra prostate and rectum were performed and the dosing parameters were established. Targeted dose was 145 gray. We then came to the operating suite suite for placement of the needles. A second timeout was performed. All needle passage was done with real-time transrectal ultrasound guidance with the sagittal plane. A total of 17 needles were placed.  60 active seeds were implanted.  The brachytherapy template was then removed.   A site in the midline was  selected on the perineum for placement of an 18 g needle with saline.  The needle was advanced above the rectum and below Denonvillier's fascia to the mid gland and confirmed to be in the midline on transverse imaging.  One cc of saline was injected confirming appropriate expansion of this space.  A total of 5 cc of saline was then injected to open the space further bilaterally.  The saline syringe was then removed and the SpaceOAR hydrogel was injected with good distribution bilaterally.

## 2024-01-08 NOTE — Anesthesia Postprocedure Evaluation (Signed)
 Anesthesia Post Note  Patient: TAMARIUS ROSENFIELD  Procedure(s) Performed: INSERTION, RADIATION SOURCE, PROSTATE (Rectum) INJECTION, HYDROGEL SPACER (Rectum)     Patient location during evaluation: PACU Anesthesia Type: General Level of consciousness: awake and alert Pain management: pain level controlled Vital Signs Assessment: post-procedure vital signs reviewed and stable Respiratory status: spontaneous breathing, nonlabored ventilation and respiratory function stable Cardiovascular status: blood pressure returned to baseline and stable Postop Assessment: no apparent nausea or vomiting Anesthetic complications: no   No notable events documented.  Last Vitals:  Vitals:   01/08/24 1019 01/08/24 1030  BP:    Pulse: (!) 57   Resp: 14   Temp:  (!) (P) 36.4 C  SpO2: 100%     Last Pain:  Vitals:   01/08/24 1100  TempSrc:   PainSc: 0-No pain                 Marieelena Bartko

## 2024-01-08 NOTE — Anesthesia Preprocedure Evaluation (Addendum)
 Anesthesia Evaluation  Patient identified by MRN, date of birth, ID band Patient awake    Reviewed: Allergy & Precautions, NPO status , Patient's Chart, lab work & pertinent test results, reviewed documented beta blocker date and time   History of Anesthesia Complications (+) PONV and history of anesthetic complications  Airway Mallampati: II  TM Distance: >3 FB Neck ROM: Full    Dental  (+) Dental Advisory Given   Pulmonary neg shortness of breath, neg sleep apnea, neg COPD, neg recent URI   breath sounds clear to auscultation       Cardiovascular + dysrhythmias  Rhythm:Regular     Neuro/Psych negative neurological ROS  negative psych ROS   GI/Hepatic negative GI ROS, Neg liver ROS,,,  Endo/Other  negative endocrine ROS    Renal/GU negative Renal ROS     Musculoskeletal negative musculoskeletal ROS (+)    Abdominal   Peds  Hematology negative hematology ROS (+) Lab Results      Component                Value               Date                      WBC                      4.9                 01/07/2024                HGB                      15.1                01/07/2024                HCT                      44.7                01/07/2024                MCV                      91.8                01/07/2024                PLT                      229                 01/07/2024              Anesthesia Other Findings   Reproductive/Obstetrics                             Anesthesia Physical Anesthesia Plan  ASA: 2  Anesthesia Plan: General   Post-op Pain Management: Ofirmev IV (intra-op)* and Toradol IV (intra-op)*   Induction: Intravenous  PONV Risk Score and Plan: 4 or greater and Ondansetron and Dexamethasone  Airway Management Planned: LMA and Oral ETT  Additional Equipment: None  Intra-op Plan:   Post-operative Plan: Extubation in OR  Informed Consent: I have  reviewed the patients History and Physical,  chart, labs and discussed the procedure including the risks, benefits and alternatives for the proposed anesthesia with the patient or authorized representative who has indicated his/her understanding and acceptance.     Dental advisory given  Plan Discussed with: CRNA  Anesthesia Plan Comments:        Anesthesia Quick Evaluation

## 2024-01-08 NOTE — Interval H&P Note (Signed)
 History and Physical Interval Note:  01/08/2024 7:47 AM  Dennis Chase  has presented today for surgery, with the diagnosis of PROSTATE CANCER.  The various methods of treatment have been discussed with the patient and family. After consideration of risks, benefits and other options for treatment, the patient has consented to  Procedure(s): INSERTION, RADIATION SOURCE, PROSTATE (N/A) INJECTION, HYDROGEL SPACER (N/A) as a surgical intervention.  The patient's history has been reviewed, patient examined, no change in status, stable for surgery.  I have reviewed the patient's chart and labs.  Questions were answered to the patient's satisfaction.     Crist Fat

## 2024-01-08 NOTE — Anesthesia Procedure Notes (Signed)
 Procedure Name: Intubation Date/Time: 01/08/2024 8:07 AM  Performed by: Ovidio Kin, CRNAPre-anesthesia Checklist: Patient identified, Emergency Drugs available, Suction available and Patient being monitored Oxygen Delivery Method: Circle system utilized Preoxygenation: Pre-oxygenation with 100% oxygen Induction Type: IV induction Ventilation: Mask ventilation without difficulty Laryngoscope Size: Mac and 4 Grade View: Grade II Tube type: Oral Tube size: 7.5 mm Number of attempts: 1 Airway Equipment and Method: Stylet Placement Confirmation: ETT inserted through vocal cords under direct vision, positive ETCO2, CO2 detector and breath sounds checked- equal and bilateral Secured at: 21 cm Tube secured with: Tape Dental Injury: Teeth and Oropharynx as per pre-operative assessment

## 2024-01-09 ENCOUNTER — Encounter (HOSPITAL_COMMUNITY): Payer: Self-pay | Admitting: Urology

## 2024-01-15 NOTE — Progress Notes (Signed)
 Patient was a RadOnc Consult on 11/10/23 for his stage T1c adenocarcinoma of the prostate with Gleason score of 4+3, and PSA of 5.82.  Patient proceed with treatment recommendations of brachytherapy and had his treatment on 01/08/24.   Patient is scheduled for a post seed CT Sim on 4/10 and will follow up with urology on 4/7.

## 2024-01-19 ENCOUNTER — Encounter (HOSPITAL_COMMUNITY): Payer: Self-pay | Admitting: Urology

## 2024-01-19 ENCOUNTER — Telehealth: Payer: Self-pay | Admitting: *Deleted

## 2024-01-19 NOTE — Telephone Encounter (Signed)
 RETURNED PATIENT'S PHONE CALL, SPOKE WITH PATIENT. ?

## 2024-01-23 ENCOUNTER — Telehealth: Payer: Self-pay | Admitting: *Deleted

## 2024-01-23 ENCOUNTER — Other Ambulatory Visit: Payer: Self-pay | Admitting: Urology

## 2024-01-23 DIAGNOSIS — C61 Malignant neoplasm of prostate: Secondary | ICD-10-CM

## 2024-01-23 NOTE — Telephone Encounter (Signed)
 CALLED PATIENT TO INFORM OF MRI FOR 01-28-24- ARRIVAL TIME- 12:15 PM ,@ [facility], PATIENT TO BE NPO- 4 HRS. PRIOR TO SCAN,SPOKE WITH PATIENT AND HE IS AWARE OF THIS SCAN AND THE INSTRUCTIONS

## 2024-01-27 ENCOUNTER — Telehealth: Payer: Self-pay | Admitting: *Deleted

## 2024-01-27 NOTE — Telephone Encounter (Signed)
 CALLED PATIENT TO REMIND OF POST SEED APPTS. AND MRI, SPOKE WITH PATIENT AND HE IS AWARE OF THESE APPTS. AND THE INSTRUCTIONS

## 2024-01-28 ENCOUNTER — Ambulatory Visit (HOSPITAL_COMMUNITY)
Admission: RE | Admit: 2024-01-28 | Discharge: 2024-01-28 | Disposition: A | Discharge status: Home / Self Care | Source: Ambulatory Visit | Attending: Urology | Admitting: Urology

## 2024-01-28 ENCOUNTER — Encounter: Payer: Self-pay | Admitting: Urology

## 2024-01-28 ENCOUNTER — Ambulatory Visit
Admission: RE | Admit: 2024-01-28 | Discharge: 2024-01-28 | Disposition: A | Discharge status: Home / Self Care | Payer: Self-pay | Source: Ambulatory Visit | Attending: Urology | Admitting: Urology

## 2024-01-28 ENCOUNTER — Ambulatory Visit
Admission: RE | Admit: 2024-01-28 | Discharge: 2024-01-28 | Disposition: A | Discharge status: Home / Self Care | Source: Ambulatory Visit | Attending: Radiation Oncology | Admitting: Radiation Oncology

## 2024-01-28 VITALS — BP 130/84 | HR 65 | Temp 97.4°F | Resp 18 | Ht 70.5 in | Wt 193.6 lb

## 2024-01-28 DIAGNOSIS — Z7982 Long term (current) use of aspirin: Secondary | ICD-10-CM | POA: Insufficient documentation

## 2024-01-28 DIAGNOSIS — Z923 Personal history of irradiation: Secondary | ICD-10-CM | POA: Insufficient documentation

## 2024-01-28 DIAGNOSIS — C61 Malignant neoplasm of prostate: Secondary | ICD-10-CM | POA: Insufficient documentation

## 2024-01-28 DIAGNOSIS — Z79899 Other long term (current) drug therapy: Secondary | ICD-10-CM | POA: Insufficient documentation

## 2024-01-28 DIAGNOSIS — Q059 Spina bifida, unspecified: Secondary | ICD-10-CM | POA: Insufficient documentation

## 2024-01-28 NOTE — Progress Notes (Signed)
  Radiation Oncology         [phone] ________________________________  Name: Dennis Chase MRN: [id number]  Date: 01/28/2024  DOB: [birth year]  COMPLEX SIMULATION NOTE  NARRATIVE:  The patient was brought to the CT Simulation planning suite today following prostate seed implantation approximately one month ago.  Identity was confirmed.  All relevant records and images related to the planned course of therapy were reviewed.  Then, the patient was set-up supine.  CT images were obtained.  MRI images were obtained in radiology.  The CT images were loaded into the planning software and fused with the MRI.  Then the prostate and rectum were contoured.  Treatment planning then occurred.  The implanted iodine 125 seeds were identified by the physics staff for projection of radiation distribution  I have requested : 3D Simulation  I have requested a DVH of the following structures: Prostate and rectum.    ________________________________  Trilby Fujisawa Lorri Rota, M.D.

## 2024-01-28 NOTE — Progress Notes (Signed)
 Radiation Oncology         [phone] ________________________________  Name: Dennis Chase MRN: [id number]  Date: 01/28/2024  DOB: 07-23-1961  Post-Seed Follow-Up Visit Note  CC: Georgann Housekeeper, MD  Crist Fat, MD  Diagnosis:   63 y.o. gentleman with Stage T1c adenocarcinoma of the prostate with Gleason score of 4+3, and PSA of 5.82.     ICD-10-CM   1. Malignant neoplasm of prostate (HCC)  C61       Interval Since Last Radiation:  3 weeks 01/08/24:  Insertion of radioactive I-125 seeds into the prostate gland; 145 Gy, definitive therapy with placement of SpaceOAR gel.  Narrative:  The patient returns today for routine follow-up.  He denies any bothersome urinary symptoms or changes from his baseline. He has a neurogenic bladder secondary to spina bifida and performs CIC 5x daily since 07/2022.  He has not had any difficulty with CIC and denies any gross hematuria, flank pain, fever, chills or malodorous urine.  He has had some loose stools and increased bowel frequency for the last week but this is gradually improving and manageable.  He has not noticed any significant impact on his energy level and overall, is quite pleased with his progress to date.  ALLERGIES:  is allergic to morphine and latex.  Meds: Current Outpatient Medications  Medication Sig Dispense Refill   aspirin EC 81 MG tablet Take 81 mg by mouth daily.     famotidine (PEPCID) 40 MG tablet Take 40 mg by mouth at bedtime.     flecainide (TAMBOCOR) 100 MG tablet Take 100 mg by mouth 2 (two) times daily.     gabapentin (NEURONTIN) 300 MG capsule Take 300 mg by mouth daily as needed (pain).     ibuprofen (ADVIL,MOTRIN) 200 MG tablet Take 200 mg by mouth every 6 (six) hours as needed for moderate pain (pain score 4-6).     metoprolol tartrate (LOPRESSOR) 25 MG tablet Take 0.5 tablets (12.5 mg total) by mouth 2 (two) times daily. 60 tablet 11   phenazopyridine (PYRIDIUM) 200 MG tablet Take 1 tablet (200 mg  total) by mouth 3 (three) times daily as needed for pain. 10 tablet 0   sildenafil (VIAGRA) 50 MG tablet Take 50 mg by mouth as needed for erectile dysfunction.     traMADol (ULTRAM) 50 MG tablet Take 1-2 tablets (50-100 mg total) by mouth every 6 (six) hours as needed for moderate pain (pain score 4-6). 15 tablet 0   zolpidem (AMBIEN) 10 MG tablet Take 5 mg by mouth at bedtime as needed for sleep.   0   No current facility-administered medications for this encounter.    Physical Findings: In general this is a well appearing Caucasian male in no acute distress. He's alert and oriented x4 and appropriate throughout the examination. Cardiopulmonary assessment is negative for acute distress and he exhibits normal effort.   Lab Findings: Lab Results  Component Value Date   WBC 4.9 01/07/2024   HGB 15.1 01/07/2024   HCT 44.7 01/07/2024   MCV 91.8 01/07/2024   PLT 229 01/07/2024    Radiographic Findings:  Patient underwent CT imaging in our clinic for post implant dosimetry. The CT will be reviewed by Dr. Kathrynn Running to confirm there is an adequate distribution of radioactive seeds throughout the prostate gland and ensure that there are no seeds in or near the rectum.  We suspect the final radiation plan and dosimetry will show appropriate coverage of the prostate gland. He  understands that we will call and inform him of any unexpected findings on further review of his imaging and dosimetry.  Impression/Plan: 63 y.o. gentleman with Stage T1c adenocarcinoma of the prostate with Gleason score of 4+3, and PSA of 5.82.  The patient is recovering from the effects of radiation. He has aneurogenic bladder secondary to spina bifida so he really has no bothersome urinary symptoms or change from his baseline. He continues to perform CIC 5-6x/day without difficulty.  He is encouraged by his progress already and is otherwise pleased with his outcome. We talked about long-term follow-up for prostate cancer  following seed implant. He understands that ongoing PSA determinations and digital rectal exams will help perform surveillance to rule out disease recurrence. He had a recent follow up appointment at the urology office with Anner Crete on 01/26/24. He will be moving to [place] in May 2025 and has already established care with a PCP there. I strongly encouraged him to establish care with a Urologist there as well for continued monitoring of the PSA and he is in agreement.  We are more than happy to forward our records to his new urologist once he establishes care.  He understands what to expect with his PSA measures. Patient was also educated today about some of the long-term effects from radiation including a small risk for rectal bleeding and possibly erectile dysfunction. We talked about some of the general management approaches to these potential complications. However, I did encourage the patient to contact our office or return at any point if he has questions or concerns related to his previous radiation and prostate cancer.    Marguarite Arbour, PA-C

## 2024-01-28 NOTE — Progress Notes (Addendum)
 Post-seed nursing interview for a diagnosis of Malignant neoplasm of Prostate.  Patient identity verified x2.   Patient reports occasional burning sensation 2/10. Patient states "He also does not have much feeling in the groin area." This issue predates his CX DX. Patient is currently self-caths for urination. Patient denies all other related issues at this time.  Meaningful use complete.  I-PSS (AUA) N/A- Self-cath  SHIM (ED) score- 15 Urinary Management medication(s) None Urology appointment date- Pending w/ Dr. Marlou Porch, but pt is moving out of state soon. At [facility]  Vitals- BP 130/84 (BP Location: Left Arm, Patient Position: Sitting, Cuff Size: Large)   Pulse 65   Temp (!) 97.4 F (36.3 C) (Temporal)   Resp 18   Ht 5' 10.5" (1.791 m)   Wt 193 lb 9.6 oz (87.8 kg)   SpO2 97%   BMI 27.39 kg/m   This concludes the interaction.  Dennis Favors, LPN

## 2024-01-29 ENCOUNTER — Ambulatory Visit: Payer: Self-pay | Admitting: Urology

## 2024-01-29 ENCOUNTER — Ambulatory Visit: Admitting: Radiation Oncology

## 2024-02-05 ENCOUNTER — Encounter: Payer: Self-pay | Admitting: Radiation Oncology

## 2024-02-05 DIAGNOSIS — C61 Malignant neoplasm of prostate: Secondary | ICD-10-CM | POA: Diagnosis not present

## 2024-02-05 NOTE — Radiation Completion Notes (Signed)
 Patient Name: Dennis Chase, Dennis Chase MRN: [id number] Date of Birth: [birth year] Referring Physician: BENJAMIN HERRICK, M.D. Date of Service: 2024-02-05 Radiation Oncologist: Bartholome Ligas, M.D. [facility]                             RADIATION ONCOLOGY END OF TREATMENT NOTE     Diagnosis: C61 Malignant neoplasm of prostate Staging on 2023-09-04: Malignant neoplasm of prostate (HCC) T=cT1c, N=cN0, M=cM0 Intent: Curative     ==========DELIVERED PLANS==========  Prostate Seed Implant Date: 2024-01-08   Plan Name: Prostate Seed Implant Site: Prostate Technique: Radioactive Seed Implant I-125 Mode: Brachytherapy Dose Per Fraction: 145 Gy Prescribed Dose (Delivered / Prescribed): 145 Gy / 145 Gy Prescribed Fxs (Delivered / Prescribed): 1 / 1     ==========ON TREATMENT VISIT DATES========== 2024-01-08     ==========UPCOMING VISITS==========

## 2024-02-17 NOTE — Progress Notes (Signed)
  Radiation Oncology         [phone] ________________________________  Name: Dennis Chase MRN: [id number]  Date: 02/05/2024  DOB: 06-17-1961  3D Planning Note   Prostate Brachytherapy Post-Implant Dosimetry  Diagnosis: 63 y.o. gentleman with Stage T1c adenocarcinoma of the prostate with Gleason score of 4+3, and PSA of 5.82.   Narrative: On a previous date, Dennis Chase returned following prostate seed implantation for post implant planning. He underwent CT scan complex simulation to delineate the three-dimensional structures of the pelvis and demonstrate the radiation distribution.  Since that time, the seed localization, and complex isodose planning with dose volume histograms have now been completed.  Results:   Prostate Coverage - The dose of radiation delivered to the 90% or more of the prostate gland (D90) was 114.04% of the prescription dose. This exceeds our goal of greater than 90%. Rectal Sparing - The volume of rectal tissue receiving the prescription dose or higher was 0.0 cc. This falls under our thresholds tolerance of 1.0 cc.  Impression: The prostate seed implant appears to show adequate target coverage and appropriate rectal sparing.  Plan:  The patient will continue to follow with urology for ongoing PSA determinations. I would anticipate a high likelihood for local tumor control with minimal risk for rectal morbidity.  ________________________________  Trilby Fujisawa Lorri Rota, M.D.

## 2024-02-23 ENCOUNTER — Encounter: Payer: Self-pay | Admitting: *Deleted

## 2024-04-20 NOTE — Telephone Encounter (Signed)
 Received referral for: Afib Referred by: Dr. Rosi Last OFV notes: 04/15/24 - PCP office visit - care everywhere Echo: NA ECG: 10/24/23 - documented Afib  Holter: NA Appropriate for Afib clinic.  Triage accepted.

## 2024-04-30 ENCOUNTER — Encounter: Payer: Self-pay | Admitting: *Deleted

## 2024-05-31 ENCOUNTER — Encounter: Payer: Self-pay | Admitting: *Deleted

## 2024-05-31 NOTE — Progress Notes (Signed)
 SCP summary completed and mailed to pt.

## 2024-06-08 ENCOUNTER — Inpatient Hospital Stay: Attending: Adult Health | Admitting: *Deleted

## 2024-06-08 ENCOUNTER — Encounter: Payer: Self-pay | Admitting: *Deleted

## 2024-06-08 DIAGNOSIS — C61 Malignant neoplasm of prostate: Secondary | ICD-10-CM

## 2024-06-08 NOTE — Progress Notes (Addendum)
 SCP reviewed and completed. Allergies and meds reviewed and updated. Vaccines reviewed and discussed. Pt did express that he feels more tired on some days since having treatment. Pt is getting exercise in, he walks 3x a week for 30 minutes with his dog and bikes 16 miles a week. Last colonoscopy was 05/19/2024. Repeat in 5 years. Nutrition and diet discussed, offered pt to utilize our virtual nutrition classes. Pt has spina bifida and self-cath's 4-5 daily and says everything is the same as it's always been. He feels he empties bladder completely with each void. Most recent PSA was 3.24 on 04/15/2024 at [facility] labs in [place]. Pt is in the process of finding an urologist in [place]  where he now resides.
